# Patient Record
Sex: Male | Born: 1985
Health system: Southern US, Community
[De-identification: ages and names within clinical notes are randomized; demographics above are authoritative.]

## PROBLEM LIST (undated history)

## (undated) DIAGNOSIS — K649 Unspecified hemorrhoids: Secondary | ICD-10-CM

## (undated) DIAGNOSIS — J36 Peritonsillar abscess: Secondary | ICD-10-CM

## (undated) HISTORY — DX: Peritonsillar abscess: J36

## (undated) HISTORY — DX: Unspecified hemorrhoids: K64.9

---

## 2009-05-26 ENCOUNTER — Emergency Department (HOSPITAL_COMMUNITY): Admission: EM | Admit: 2009-05-26 | Discharge: 2009-05-26 | Payer: Self-pay | Admitting: Emergency Medicine

## 2011-01-24 LAB — URINE CULTURE: Culture: NO GROWTH

## 2011-01-24 LAB — URINALYSIS, ROUTINE W REFLEX MICROSCOPIC
Glucose, UA: NEGATIVE mg/dL
Ketones, ur: NEGATIVE mg/dL
Leukocytes, UA: NEGATIVE
Nitrite: NEGATIVE
Protein, ur: 30 mg/dL — AB
pH: 5.5 (ref 5.0–8.0)

## 2011-01-24 LAB — URINE MICROSCOPIC-ADD ON

## 2011-07-12 IMAGING — CT CT PELVIS W/O CM
2 of 4 series · 17 of 46 positions shown, 19 images · non-contrast
Comparison: None

CT ABDOMEN

CLINICAL DATA: Right-sided flank pain

CT ABDOMEN AND PELVIS WITHOUT CONTRAST
TECHNIQUE: Multidetector CT imaging of the abdomen and pelvis was
performed following the standard protocol without intravenous
contrast.

[Series 2: under 200# stone no prev · axial · 0.62mm/px · z∈[-386,-72]mm · 14 of 69 slices shown, 16 images]
[im 3/69  soft-tissue]
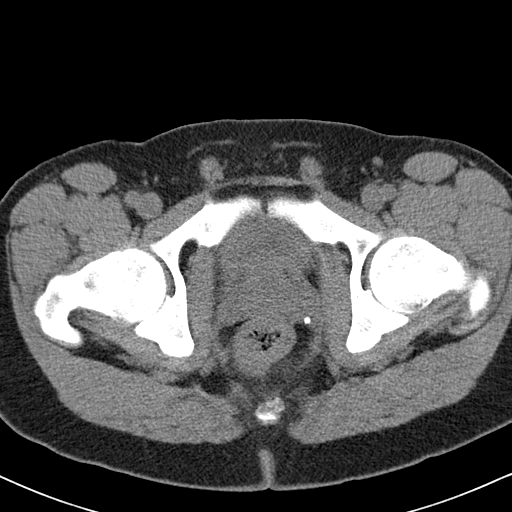
[im 3/69  bone]
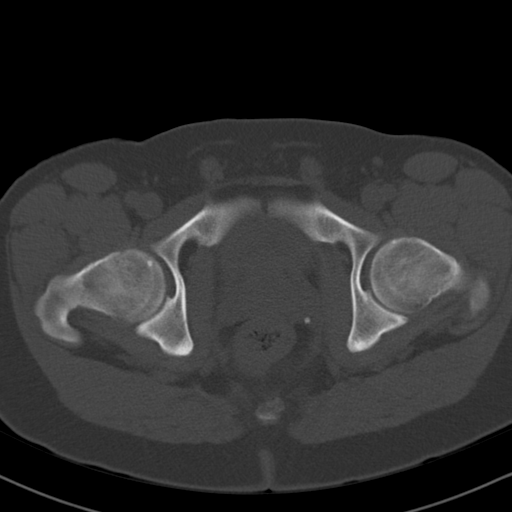
[im 8/69  soft-tissue]
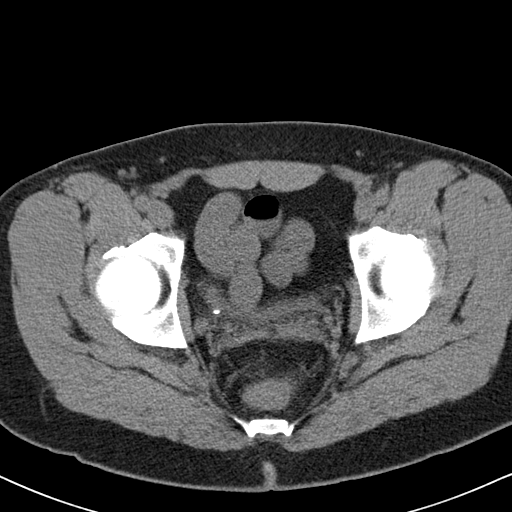
[im 13/69  soft-tissue]
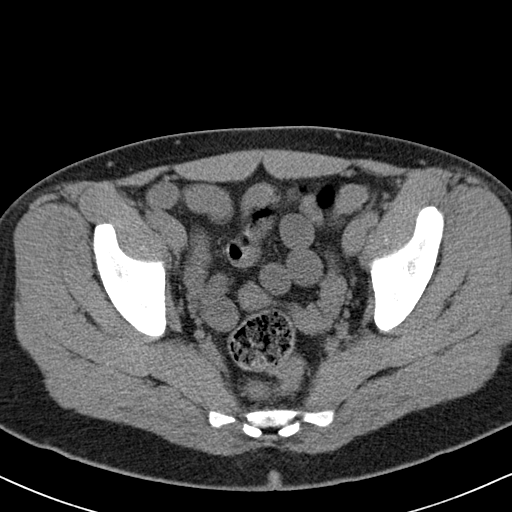
[im 18/69  soft-tissue]
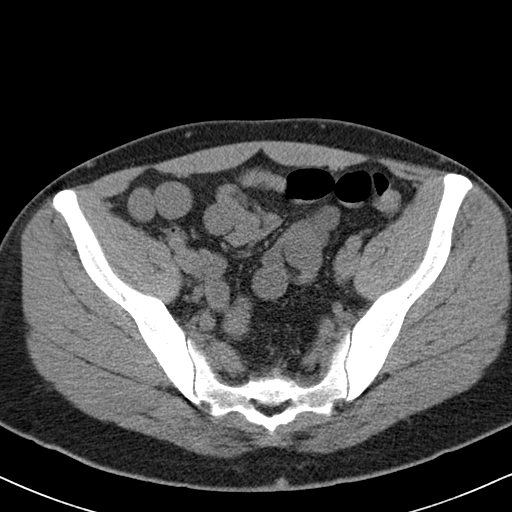
[im 23/69  soft-tissue]
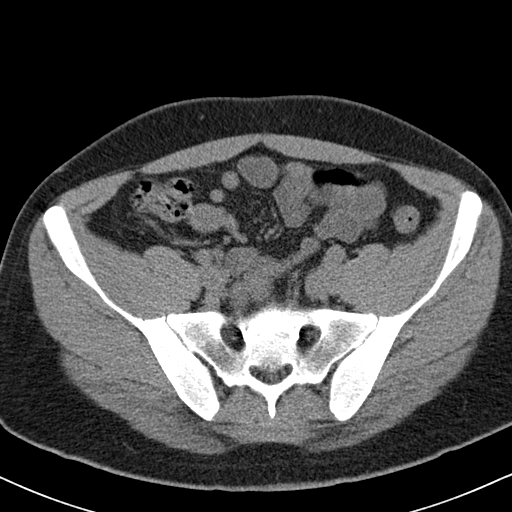
[im 28/69  soft-tissue]
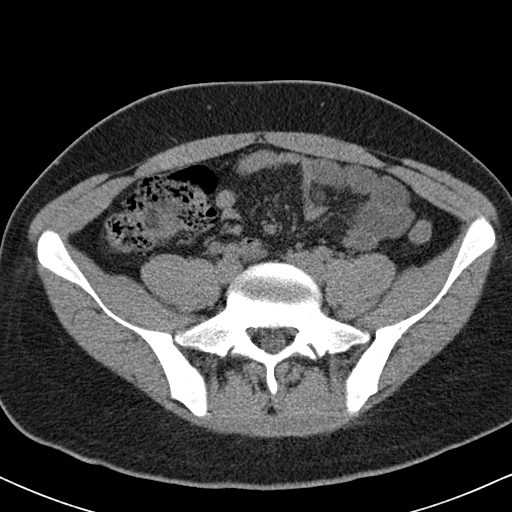
[im 33/69  soft-tissue]
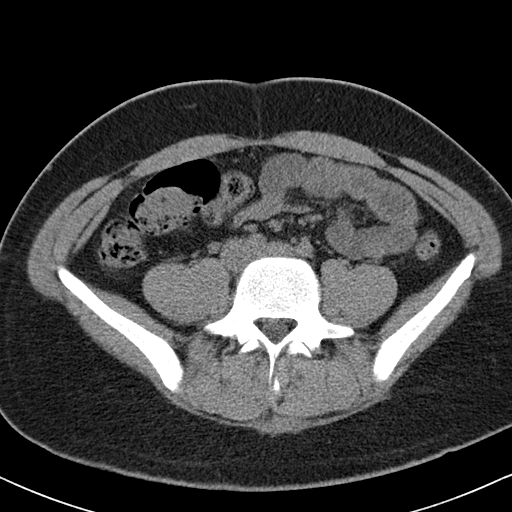
[im 36/69  soft-tissue]
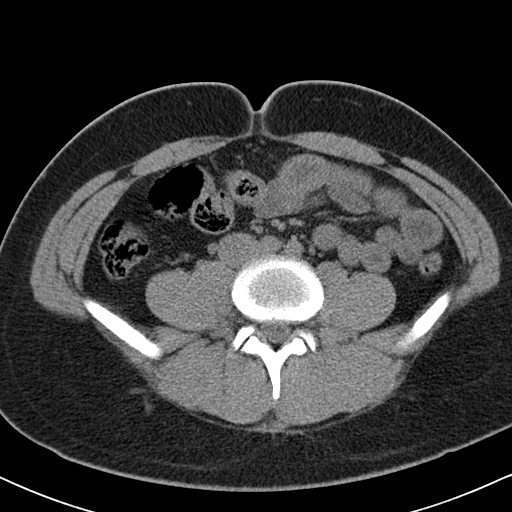
[im 41/69  soft-tissue]
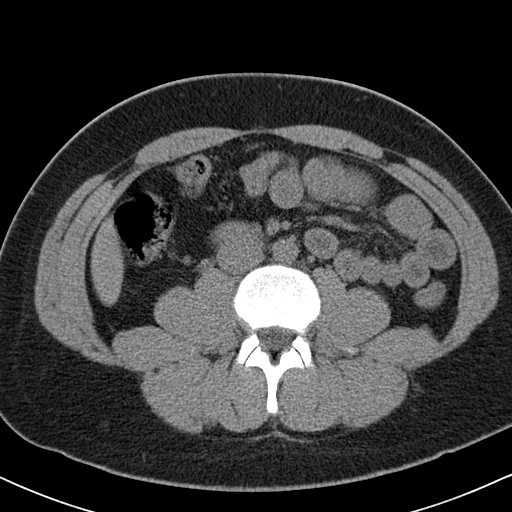
[im 41/69  bone]
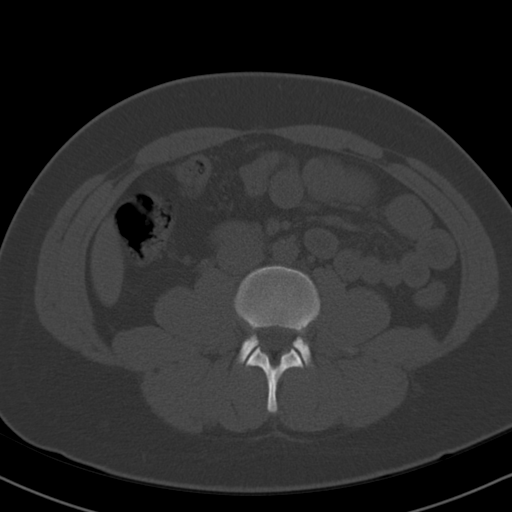
[im 46/69  soft-tissue]
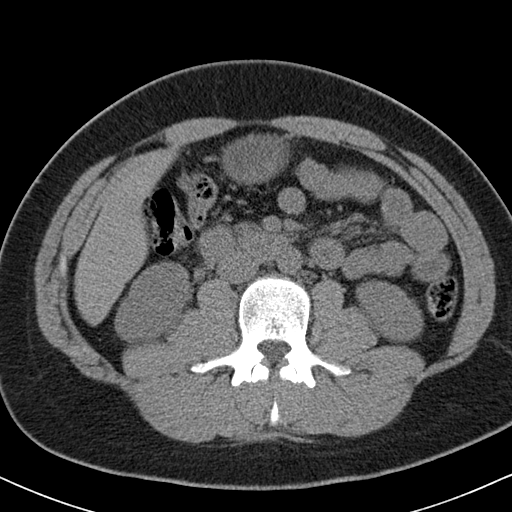
[im 51/69  soft-tissue]
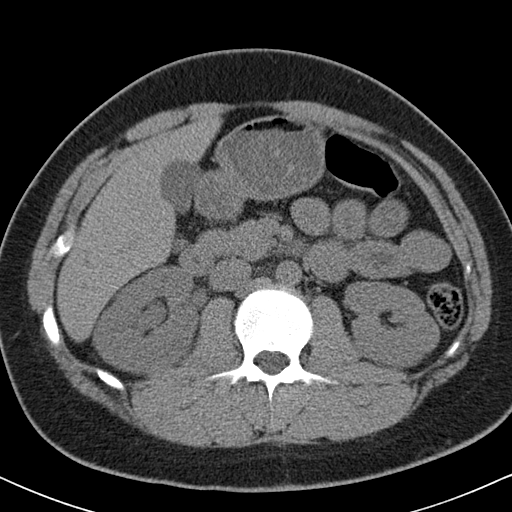
[im 56/69  soft-tissue]
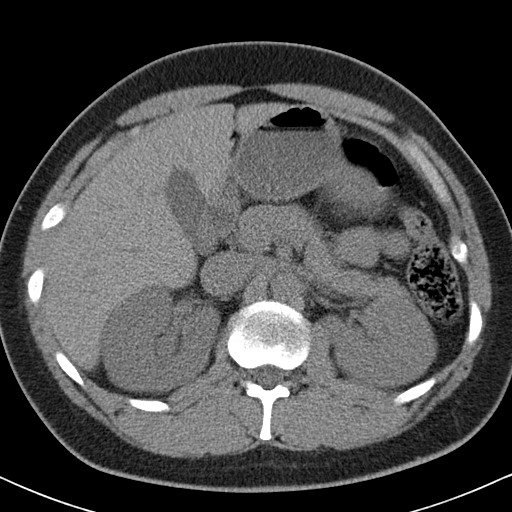
[im 61/69  soft-tissue]
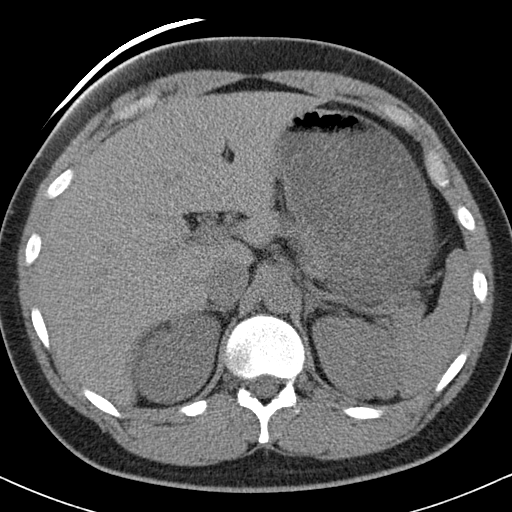
[im 66/69  soft-tissue]
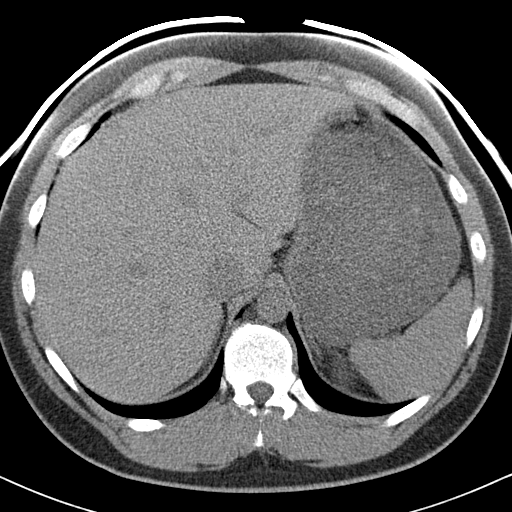

[Series 602: <mpr thick range> · coronal · 0.71mm/px · 3 of 74 slices shown]
[im 25/74  soft-tissue]
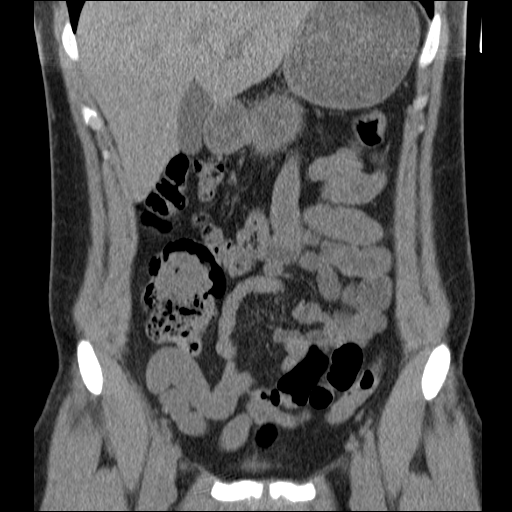
[im 33/74  soft-tissue]
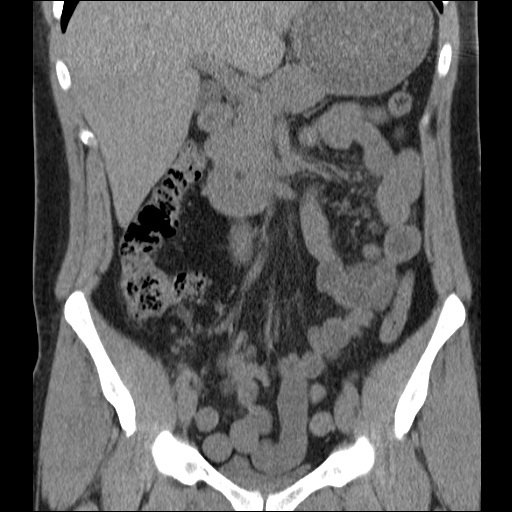
[im 41/74  soft-tissue]
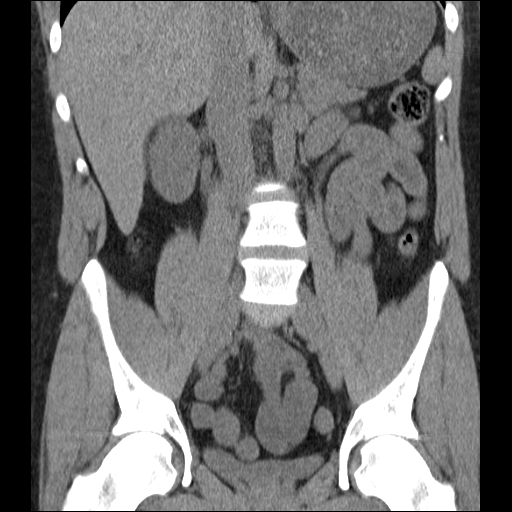

[17 of 46 positions shown; findings below may reference images not displayed]

FINDINGS: There is mild right hydroureteronephrosis to the level
of a 5 mm distal right ureteral stone.  The bladder is
decompressed.  No other radiopaque renal or ureteral stone is
identified on either side.

Visualized aspects of the unenhanced abdominal viscera are
otherwise unremarkable.  No free fluid or air.
IMPRESSION: 5 mm distal right ureteral calculus producing mild right
hydroureteronephrosis.

CT PELVIS
FINDINGS: The appendix and unopacified bowel are unremarkable.  No
pelvic free fluid.  Sclerotic foci in the sacrum and right femur
are most likely bone islands.  No acute osseous abnormality.
IMPRESSION: No acute intrapelvic finding.  Please see CT abdomen report above.

## 2011-11-12 ENCOUNTER — Encounter (HOSPITAL_COMMUNITY): Payer: Self-pay | Admitting: Emergency Medicine

## 2011-11-12 ENCOUNTER — Emergency Department (HOSPITAL_COMMUNITY)
Admission: EM | Admit: 2011-11-12 | Discharge: 2011-11-12 | Disposition: A | Discharge status: Home / Self Care | Payer: 59 | Attending: Emergency Medicine | Admitting: Emergency Medicine

## 2011-11-12 DIAGNOSIS — R509 Fever, unspecified: Secondary | ICD-10-CM | POA: Insufficient documentation

## 2011-11-12 DIAGNOSIS — Z79899 Other long term (current) drug therapy: Secondary | ICD-10-CM | POA: Insufficient documentation

## 2011-11-12 DIAGNOSIS — J36 Peritonsillar abscess: Secondary | ICD-10-CM | POA: Insufficient documentation

## 2011-11-12 DIAGNOSIS — F17200 Nicotine dependence, unspecified, uncomplicated: Secondary | ICD-10-CM | POA: Insufficient documentation

## 2011-11-12 LAB — CBC
MCHC: 34 g/dL (ref 30.0–36.0)
Platelets: 303 10*3/uL (ref 150–400)
RDW: 13.8 % (ref 11.5–15.5)
WBC: 19.8 10*3/uL — ABNORMAL HIGH (ref 4.0–10.5)

## 2011-11-12 LAB — DIFFERENTIAL
Basophils Absolute: 0.1 10*3/uL (ref 0.0–0.1)
Basophils Relative: 1 % (ref 0–1)
Lymphocytes Relative: 20 % (ref 12–46)
Neutro Abs: 14.4 10*3/uL — ABNORMAL HIGH (ref 1.7–7.7)
Neutrophils Relative %: 72 % (ref 43–77)

## 2011-11-12 MED ORDER — MORPHINE SULFATE 4 MG/ML IJ SOLN
4.0000 mg | Freq: Once | INTRAMUSCULAR | Status: AC
  Administered 2011-11-12: 4 mg via INTRAVENOUS
  Filled 2011-11-12: qty 1

## 2011-11-12 MED ORDER — ONDANSETRON HCL 4 MG/2ML IJ SOLN
4.0000 mg | Freq: Once | INTRAMUSCULAR | Status: AC
  Administered 2011-11-12: 4 mg via INTRAVENOUS
  Filled 2011-11-12: qty 2

## 2011-11-12 MED ORDER — CLINDAMYCIN PHOSPHATE 900 MG/50ML IV SOLN
900.0000 mg | Freq: Once | INTRAVENOUS | Status: AC
  Administered 2011-11-12: 900 mg via INTRAVENOUS
  Filled 2011-11-12: qty 50

## 2011-11-12 MED ORDER — CLINDAMYCIN HCL 150 MG PO CAPS: 300.0000 mg | ORAL_CAPSULE | Freq: Three times a day (TID) | ORAL | Status: DC

## 2011-11-12 MED ORDER — DEXAMETHASONE SODIUM PHOSPHATE 10 MG/ML IJ SOLN
10.0000 mg | Freq: Once | INTRAMUSCULAR | Status: AC
  Administered 2011-11-12: 10 mg via INTRAVENOUS
  Filled 2011-11-12: qty 1

## 2011-11-12 MED ORDER — SODIUM CHLORIDE 0.9 % IV BOLUS (SEPSIS)
500.0000 mL | Freq: Once | INTRAVENOUS | Status: AC
  Administered 2011-11-12: 500 mL via INTRAVENOUS

## 2011-11-12 MED ORDER — HYDROCODONE-ACETAMINOPHEN 7.5-500 MG/15ML PO SOLN: 15.0000 mL | Freq: Four times a day (QID) | ORAL | Status: DC | PRN

## 2011-11-12 NOTE — ED Provider Notes (Signed)
Pt with a sore throat. pe small tonsilar swelling and abscess.  Medical screening examination/treatment/procedure(s) were conducted as a shared visit with non-physician practitioner(s) and myself.  I personally evaluated the patient during the encounter   Benny Lennert, MD 11/12/11 2304

## 2011-11-12 NOTE — ED Notes (Signed)
Pt alert, nad, c/o sore throat, onset yesterday, went to [facility] this AM, - strep, Right tonsil visualized, + pustule noted, airway patent, skin pwd, resp even unlabored

## 2011-11-12 NOTE — ED Provider Notes (Signed)
History     CSN: [id number]  Arrival date & time 11/12/11  2100   First MD Initiated Contact with Patient 11/12/11 2124      Chief Complaint  Patient presents with  . Sore Throat    (Consider location/radiation/quality/duration/timing/severity/associated sxs/prior treatment) HPI Comments: Patient with progressively worsening sore throat, worse on the right, left, over the past 2 days when to me in the clinic today had a negative strep test was told to take over-the-counter ibuprofen and drink clear fluids.  Patient has been doing this, but now reports increased difficulty swallowing, despite over-the-counter use of ibuprofen or Tylenol for fevers, chills, and a muffled voice  Patient is a 26 y.o. male presenting with pharyngitis. The history is provided by the patient. The history is limited by a language barrier.  Sore Throat This is a new problem. The current episode started yesterday. The problem occurs constantly. The problem has been gradually worsening. Associated symptoms include anorexia, chills, a fever, a sore throat and swollen glands. Pertinent negatives include no congestion, myalgias, nausea, neck pain or vomiting.    History reviewed. No pertinent past medical history.  History reviewed. No pertinent past surgical history.  No family history on file.  History  Substance Use Topics  . Smoking status: Current Everyday Smoker -- 1.0 packs/day  . Smokeless tobacco: Not on file  . Alcohol Use: No      Review of Systems  Constitutional: Positive for fever and chills.  HENT: Positive for sore throat, trouble swallowing and voice change. Negative for congestion and neck pain.   Respiratory: Negative for shortness of breath.   Gastrointestinal: Positive for anorexia. Negative for nausea and vomiting.  Musculoskeletal: Negative for myalgias.  Neurological: Negative for dizziness.    Allergies  Review of patient's allergies indicates no known allergies.  Home  Medications   Current Outpatient Rx  Name Route Sig Dispense Refill  . CALCIUM CARBONATE 600 MG PO TABS Oral Take 600 mg by mouth daily.    Marland Kitchen VITAMIN D 1000 UNITS PO TABS Oral Take 1,000 Units by mouth daily.    Marland Kitchen ESCITALOPRAM OXALATE 10 MG PO TABS Oral Take 10 mg by mouth daily.    . OMEGA-3 FATTY ACIDS 1000 MG PO CAPS Oral Take 1 g by mouth daily.    Marland Kitchen MENTHOL 3 MG MT LOZG Oral Take 1 lozenge by mouth as needed. Sore throat    . CLINDAMYCIN HCL 150 MG PO CAPS Oral Take 2 capsules (300 mg total) by mouth 3 (three) times daily. 28 capsule 0  . HYDROCODONE-ACETAMINOPHEN 7.5-500 MG/15ML PO SOLN Oral Take 15 mLs by mouth every 6 (six) hours as needed for pain. 120 mL 0    BP 121/69  Pulse 87  Temp(Src) 98.6 F (37 C) (Oral)  Resp 20  Wt 230 lb (104.327 kg)  SpO2 98%  Physical Exam  Constitutional: He is oriented to person, place, and time. He appears well-developed.  HENT:  Mouth/Throat: Uvula swelling present. Posterior oropharyngeal edema, posterior oropharyngeal erythema and tonsillar abscesses present.       R tonsil swollen purulent Uvula deviates to the L   Eyes: Pupils are equal, round, and reactive to light.  Neck: Normal range of motion.  Cardiovascular: Normal rate.   Pulmonary/Chest: Effort normal.  Musculoskeletal: Normal range of motion.  Neurological: He is alert and oriented to person, place, and time.  Skin: Skin is warm and dry.    ED Course  Procedures (including critical care time)  Labs Reviewed  CBC - Abnormal; Notable for the following:    WBC 19.8 (*)    All other components within normal limits  DIFFERENTIAL - Abnormal; Notable for the following:    Neutro Abs 14.4 (*)    Monocytes Absolute 1.4 (*)    All other components within normal limits  I-STAT, CHEM 8   No results found.   1. Peritonsillar abscess    Feels better, able to drink liquids.  Voice is stronger on reexam, uvula is still deviated to the left, but there is less edema   MDM    Enlarged right tonsil with purulent drainage, uvula deviated to the left        Arman Filter, NP 11/12/11 2303  Arman Filter, NP 11/12/11 2303

## 2011-11-13 ENCOUNTER — Encounter (HOSPITAL_COMMUNITY): Payer: Self-pay | Admitting: *Deleted

## 2011-11-13 ENCOUNTER — Emergency Department (HOSPITAL_COMMUNITY)
Admission: EM | Admit: 2011-11-13 | Discharge: 2011-11-13 | Disposition: A | Discharge status: Home / Self Care | Payer: 59 | Attending: Emergency Medicine | Admitting: Emergency Medicine

## 2011-11-13 DIAGNOSIS — R221 Localized swelling, mass and lump, neck: Secondary | ICD-10-CM | POA: Insufficient documentation

## 2011-11-13 DIAGNOSIS — R070 Pain in throat: Secondary | ICD-10-CM | POA: Insufficient documentation

## 2011-11-13 DIAGNOSIS — Z79899 Other long term (current) drug therapy: Secondary | ICD-10-CM | POA: Insufficient documentation

## 2011-11-13 DIAGNOSIS — R220 Localized swelling, mass and lump, head: Secondary | ICD-10-CM | POA: Insufficient documentation

## 2011-11-13 DIAGNOSIS — J36 Peritonsillar abscess: Secondary | ICD-10-CM | POA: Insufficient documentation

## 2011-11-13 NOTE — ED Notes (Signed)
Pt. Was seen at [facility] long by Elsie Stain for peritonsiler abscess.  Pt. Was told to come back today for re-evaluation.  Pt. Reports feeling "much better."

## 2011-11-13 NOTE — ED Provider Notes (Signed)
History     CSN: [id number]  Arrival date & time 11/13/11  1036   First MD Initiated Contact with Patient 11/13/11 1128      Chief Complaint  Patient presents with  . Sore Throat    (Consider location/radiation/quality/duration/timing/severity/associated sxs/prior treatment) Patient is a 26 y.o. male presenting with pharyngitis. The history is provided by the patient and the spouse.  Sore Throat Pertinent negatives include no headaches.  pt states right side throat pain for past few days. No fever/chills. Hurts to swallow but is able to swallow, no difficulty breathing. Constant, dull, mild pain worse w swallowing. No known ill contacts. No trauma. Was seen in ED yesterday, dx w peritonsillar abscess, and was told to return to ER for recheck. Was placed on clindamycin yesterday. Notes the soreness and swelling seem much better but not resolved.   History reviewed. No pertinent past medical history.  History reviewed. No pertinent past surgical history.  History reviewed. No pertinent family history.  History  Substance Use Topics  . Smoking status: Current Everyday Smoker -- 1.0 packs/day  . Smokeless tobacco: Not on file  . Alcohol Use: No      Review of Systems  Constitutional: Negative for fever and chills.  HENT: Negative for congestion, rhinorrhea and neck stiffness.   Respiratory: Negative for cough.   Neurological: Negative for headaches.    Allergies  Review of patient's allergies indicates no known allergies.  Home Medications   Current Outpatient Rx  Name Route Sig Dispense Refill  . CALCIUM CARBONATE 600 MG PO TABS Oral Take 600 mg by mouth daily.    Marland Kitchen VITAMIN D 1000 UNITS PO TABS Oral Take 1,000 Units by mouth daily.    Marland Kitchen COENZYME Q10 30 MG PO CAPS Oral Take 30 mg by mouth daily.    Marland Kitchen ESCITALOPRAM OXALATE 10 MG PO TABS Oral Take 10 mg by mouth daily.    . OMEGA-3 FATTY ACIDS 1000 MG PO CAPS Oral Take 1 g by mouth daily.    Marland Kitchen MENTHOL 3 MG MT LOZG Oral  Take 1 lozenge by mouth as needed. Sore throat      BP 121/73  Pulse 64  Temp(Src) 97.9 F (36.6 C) (Oral)  Resp 20  SpO2 100%  Physical Exam  Nursing note and vitals reviewed. Constitutional: He is oriented to person, place, and time. He appears well-developed and well-nourished. No distress.  HENT:  Head: Atraumatic.       Pharynx mildly erythematous. Mild asymmetric tonsillar swelling greater on right. Uvula midline. No dysphonia or stridor. No trouble swallowing.   Eyes: Conjunctivae are normal.  Neck: Neck supple. No tracheal deviation present.       No stiffness/rigidity  Cardiovascular: Normal rate.   Pulmonary/Chest: Effort normal. No accessory muscle usage. No respiratory distress.  Abdominal: He exhibits no distension.  Musculoskeletal: Normal range of motion.  Neurological: He is alert and oriented to person, place, and time.  Skin: Skin is warm and dry. No rash noted.  Psychiatric: He has a normal mood and affect.    ED Course  Procedures (including critical care time)     MDM  Reviewed prior chart and nursing notes. ent called.  Discussed pt with DR Jearld Fenton, ENT on call, as pt feels much improved, states to have continue on current abx therapy, and to have f/u office Monday if not continuing to improve/resolve, return to ED if worse prior to then.   Discussed plan w pt, pt agreeable w plan.  Suzi Roots, MD 11/13/11 1155

## 2011-11-13 NOTE — ED Notes (Signed)
MD at bedside. 

## 2011-11-15 LAB — POCT I-STAT, CHEM 8
Calcium, Ion: 1.16 mmol/L (ref 1.12–1.32)
Chloride: 106 mEq/L (ref 96–112)
HCT: 45 % (ref 39.0–52.0)
Potassium: 4.8 mEq/L (ref 3.5–5.1)
Sodium: 140 mEq/L (ref 135–145)

## 2011-11-25 NOTE — ED Provider Notes (Signed)
Medical screening examination/treatment/procedure(s) were conducted as a shared visit with non-physician practitioner(s) and myself.  I personally evaluated the patient during the encounter   Benny Lennert, MD 11/25/11 [phone]

## 2018-12-04 ENCOUNTER — Ambulatory Visit: Payer: BLUE CROSS/BLUE SHIELD | Admitting: Medical

## 2018-12-04 ENCOUNTER — Encounter: Payer: Self-pay | Admitting: Medical

## 2018-12-04 VITALS — BP 127/88 | HR 92 | Temp 98.2°F | Resp 16 | Ht 70.0 in | Wt 261.2 lb

## 2018-12-04 DIAGNOSIS — J029 Acute pharyngitis, unspecified: Secondary | ICD-10-CM | POA: Diagnosis not present

## 2018-12-04 LAB — POCT RAPID STREP A (OFFICE): Rapid Strep A Screen: POSITIVE — AB

## 2018-12-04 MED ORDER — AMOXICILLIN 875 MG PO TABS: 875.0000 mg | ORAL_TABLET | Freq: Two times a day (BID) | ORAL | 0 refills | Status: DC

## 2018-12-04 MED ORDER — PENICILLIN G BENZATHINE 2400000 UNIT/4ML IM SUSP
1.2000 10*6.[IU] | Freq: Once | INTRAMUSCULAR | Status: AC
  Administered 2018-12-04: 1200000 [IU] via INTRAMUSCULAR

## 2018-12-04 NOTE — Patient Instructions (Signed)
Your strep test was positive. We gave you bicillin im injection I am also prescribing antibiotic oral amoxicillin.  Rest hydrate, tylenol for fever and warm salt water gargles.   We need to follow you closely in light of your history of peritonsillar abscess in 2013.  Follow up in 7 days or as needed.

## 2018-12-04 NOTE — Progress Notes (Signed)
Subjective:    Patient ID: Stephen Carson, male    DOB: [birth year], 33 y.o.   MRN: [id number]  HPI  Pt here for the first time.   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 No alcohol. Smokes about 1/2 pack a day. He is married.   Pt in with swollen tonsils and painful swallowing. Mild pain Saturday. But pain worse on Sunday morning(noticed this is when first saw swelling). Some fever, some chills and body aches.   Pt has no allergies to meds.    Review of Systems  Constitutional: Positive for chills, fatigue and fever.  HENT: Positive for sore throat. Negative for congestion, nosebleeds, postnasal drip, rhinorrhea, sinus pressure and sinus pain.   Respiratory: Negative for cough, chest tightness, shortness of breath and wheezing.   Gastrointestinal: Negative for abdominal pain.  Musculoskeletal: Positive for myalgias. Negative for back pain.  Skin: Negative for rash.  Neurological: Negative for dizziness, facial asymmetry and numbness.  Hematological: Positive for adenopathy. Does not bruise/bleed easily.  Psychiatric/Behavioral: Negative for dysphoric mood and suicidal ideas. The patient is not nervous/anxious.    No past medical history on file.   Social History   Socioeconomic History  . Marital status: Married    Spouse name: Not on file  . Number of children: Not on file  . Years of education: Not on file  . Highest education level: Not on file  Occupational History  . Not on file  Social Needs  . Financial resource strain: Not on file  . Food insecurity:    Worry: Not on file    Inability: Not on file  . Transportation needs:    Medical: Not on file    Non-medical: Not on file  Tobacco Use  . Smoking status: Current Every Day Smoker    Packs/day: 1.00  Substance and Sexual Activity  . Alcohol use: No  . Drug use: No  . Sexual activity: Never  Lifestyle  . Physical activity:    Days per week: Not on file    Minutes per session: Not on file   . Stress: Not on file  Relationships  . Social connections:    Talks on phone: Not on file    Gets together: Not on file    Attends religious service: Not on file    Active member of club or organization: Not on file    Attends meetings of clubs or organizations: Not on file    Relationship status: Not on file  . Intimate partner violence:    Fear of current or ex partner: Not on file    Emotionally abused: Not on file    Physically abused: Not on file    Forced sexual activity: Not on file  Other Topics Concern  . Not on file  Social History Narrative  . Not on file    No past surgical history on file.  No family history on file.  No Known Allergies  Current Outpatient Medications on File Prior to Visit  Medication Sig Dispense Refill  . escitalopram (LEXAPRO) 10 MG tablet Take 10 mg by mouth daily.    . fish oil-omega-3 fatty acids 1000 MG capsule Take 1 g by mouth daily.    Marland Kitchen menthol-cetylpyridinium (CEPACOL) 3 MG lozenge Take 1 lozenge by mouth as needed. Sore throat     No current facility-administered medications on file prior to visit.     BP 127/88   Pulse 92   Temp 98.2 F (36.8 C) (  Oral)   Resp 16   Ht 5\' 10"  (1.778 m)   Wt 261 lb 3.2 oz (118.5 kg)   SpO2 99%   BMI 37.48 kg/m       Objective:   Physical Exam  General  Mental Status - Alert. General Appearance - Well groomed. Not in acute distress.  Skin Rashes- No Rashes.  HEENT Head- Normal. Ear Auditory Canal - Left- Normal. Right - Normal.Tympanic Membrane- Left- Normal. Right- Normal. Eye Sclera/Conjunctiva- Left- Normal. Right- Normal. Nose & Sinuses Nasal Mucosa- Left-  Boggy and Congested. Right-  Boggy and  Congested.Bilateral  no maxillary and no  frontal sinus pressure. Mouth & Throat Lips: Upper Lip- Normal: no dryness, cracking, pallor, cyanosis, or vesicular eruption. Lower Lip-Normal: no dryness, cracking, pallor, cyanosis or vesicular eruption. Buccal Mucosa- Bilateral- No  Aphthous ulcers. Oropharynx- No Discharge or Erythema. Tonsils: Characteristics- Bilateral-  Erythema . Size/Enlargement- Bilateral- 2+ enlargement(symmeteric). Discharge- bilateral-None.  Neck Neck- Supple. No Masses.   Chest and Lung Exam Auscultation: Breath Sounds:-Clear even and unlabored.  Cardiovascular Auscultation:Rythm- Regular, rate and rhythm. Murmurs & Other Heart Sounds:Ausculatation of the heart reveal- No Murmurs.  Lymphatic Head & Neck General Head & Neck Lymphatics: Bilateral: Description- Swollen submandibular lymph nodes.       Assessment & Plan:  Your strep test was positive. We gave you bicillin im injection I am also prescribing antibiotic oral amoxicillin.  Rest hydrate, tylenol for fever and warm salt water gargles.   We need to follow you closely in light of your history of peritonsillar abscess in 2013.  Follow up in 7 days or as needed.  Mackie Pai, PA-C

## 2018-12-14 ENCOUNTER — Encounter: Payer: Self-pay | Admitting: Medical

## 2018-12-14 ENCOUNTER — Ambulatory Visit: Payer: BLUE CROSS/BLUE SHIELD | Admitting: Medical

## 2018-12-14 VITALS — BP 139/80 | HR 76 | Temp 98.0°F | Resp 16 | Ht 70.0 in | Wt 268.6 lb

## 2018-12-14 DIAGNOSIS — Z0000 Encounter for general adult medical examination without abnormal findings: Secondary | ICD-10-CM | POA: Diagnosis not present

## 2018-12-14 DIAGNOSIS — Z113 Encounter for screening for infections with a predominantly sexual mode of transmission: Secondary | ICD-10-CM | POA: Diagnosis not present

## 2018-12-14 LAB — COMPREHENSIVE METABOLIC PANEL
ALBUMIN: 4.2 g/dL (ref 3.5–5.2)
ALK PHOS: 76 U/L (ref 39–117)
ALT: 68 U/L — AB (ref 0–53)
AST: 36 U/L (ref 0–37)
BILIRUBIN TOTAL: 0.4 mg/dL (ref 0.2–1.2)
BUN: 11 mg/dL (ref 6–23)
CALCIUM: 9.5 mg/dL (ref 8.4–10.5)
CO2: 29 mEq/L (ref 19–32)
CREATININE: 0.79 mg/dL (ref 0.40–1.50)
Chloride: 106 mEq/L (ref 96–112)
GFR: 111.96 mL/min (ref >60.00)
Glucose, Bld: 89 mg/dL (ref 70–99)
Potassium: 4.7 mEq/L (ref 3.5–5.1)
SODIUM: 143 meq/L (ref 135–145)
TOTAL PROTEIN: 7.4 g/dL (ref 6.0–8.3)

## 2018-12-14 LAB — CBC WITH DIFFERENTIAL/PLATELET
BASOS PCT: 1 % (ref 0.0–3.0)
Basophils Absolute: 0.1 10*3/uL (ref 0.0–0.1)
EOS ABS: 0.3 10*3/uL (ref 0.0–0.7)
Eosinophils Relative: 2.3 % (ref 0.0–5.0)
HEMATOCRIT: 41.9 % (ref 39.0–52.0)
HEMOGLOBIN: 14.2 g/dL (ref 13.0–17.0)
LYMPHS PCT: 29.9 % (ref 12.0–46.0)
Lymphs Abs: 3.8 10*3/uL (ref 0.7–4.0)
MCHC: 33.8 g/dL (ref 30.0–36.0)
MCV: 90.5 fl (ref 78.0–100.0)
MONO ABS: 0.8 10*3/uL (ref 0.1–1.0)
Monocytes Relative: 6.2 % (ref 3.0–12.0)
Neutro Abs: 7.6 10*3/uL (ref 1.4–7.7)
Neutrophils Relative %: 60.6 % (ref 43.0–77.0)
Platelets: 369 10*3/uL (ref 150.0–400.0)
RBC: 4.63 Mil/uL (ref 4.22–5.81)
RDW: 13.4 % (ref 11.5–15.5)
WBC: 12.6 10*3/uL — AB (ref 4.0–10.5)

## 2018-12-14 LAB — LIPID PANEL
CHOLESTEROL: 167 mg/dL (ref 0–200)
HDL: 28.5 mg/dL — ABNORMAL LOW (ref >39.00)
LDL Cholesterol: 102 mg/dL — ABNORMAL HIGH (ref 0–99)
NonHDL: 138.34
TRIGLYCERIDES: 183 mg/dL — AB (ref 0.0–149.0)
Total CHOL/HDL Ratio: 6
VLDL: 36.6 mg/dL (ref 0.0–40.0)

## 2018-12-14 NOTE — Patient Instructions (Addendum)
For you wellness exam today I have ordered cbc, cmp, lipid panel and hiv.  Flu vaccine declined.  Recommend exercise and healthy diet.  We will let you know lab results as they come in.  Follow up date appointment will be determined after lab review.   Your clinically feel better from strep throat. If you have recurrent pain please let me know/come back in for check up.  Counseled on stop smoking. When ready can give rx help if you want.   Preventive Care 18-39 Years, Male Preventive care refers to lifestyle choices and visits with your health care provider that can promote health and wellness. What does preventive care include?   A yearly physical exam. This is also called an annual well check.  Dental exams once or twice a year.  Routine eye exams. Ask your health care provider how often you should have your eyes checked.  Personal lifestyle choices, including: ? Daily care of your teeth and gums. ? Regular physical activity. ? Eating a healthy diet. ? Avoiding tobacco and drug use. ? Limiting alcohol use. ? Practicing safe sex. What happens during an annual well check? The services and screenings done by your health care provider during your annual well check will depend on your age, overall health, lifestyle risk factors, and family history of disease. Counseling Your health care provider may ask you questions about your:  Alcohol use.  Tobacco use.  Drug use.  Emotional well-being.  Home and relationship well-being.  Sexual activity.  Eating habits.  Work and work Statistician. Screening You may have the following tests or measurements:  Height, weight, and BMI.  Blood pressure.  Lipid and cholesterol levels. These may be checked every 5 years starting at age 61.  Diabetes screening. This is done by checking your blood sugar (glucose) after you have not eaten for a while (fasting).  Skin check.  Hepatitis C blood test.  Hepatitis B blood  test.  Sexually transmitted disease (STD) testing. Discuss your test results, treatment options, and if necessary, the need for more tests with your health care provider. Vaccines Your health care provider may recommend certain vaccines, such as:  Influenza vaccine. This is recommended every year.  Tetanus, diphtheria, and acellular pertussis (Tdap, Td) vaccine. You may need a Td booster every 10 years.  Varicella vaccine. You may need this if you have not been vaccinated.  HPV vaccine. If you are 16 or younger, you may need three doses over 6 months.  Measles, mumps, and rubella (MMR) vaccine. You may need at least one dose of MMR.You may also need a second dose.  Pneumococcal 13-valent conjugate (PCV13) vaccine. You may need this if you have certain conditions and have not been vaccinated.  Pneumococcal polysaccharide (PPSV23) vaccine. You may need one or two doses if you smoke cigarettes or if you have certain conditions.  Meningococcal vaccine. One dose is recommended if you are age 45-21 years and a first-year college student living in a residence hall, or if you have one of several medical conditions. You may also need additional booster doses.  Hepatitis A vaccine. You may need this if you have certain conditions or if you travel or work in places where you may be exposed to hepatitis A.  Hepatitis B vaccine. You may need this if you have certain conditions or if you travel or work in places where you may be exposed to hepatitis B.  Haemophilus influenzae type b (Hib) vaccine. You may need this if you  have certain risk factors. Talk to your health care provider about which screenings and vaccines you need and how often you need them. This information is not intended to replace advice given to you by your health care provider. Make sure you discuss any questions you have with your health care provider. Document Released: 11/30/2001 Document Revised: 05/17/2017 Document Reviewed:  08/05/2015 Elsevier Interactive Patient Education  2019 [employer].

## 2018-12-14 NOTE — Progress Notes (Signed)
Subjective:    Patient ID: Stephen Carson, male    DOB: 1986/01/17, 33 y.o.   MRN: [id number]  HPI  Pt here for second time.   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 No alcohol. Smokes about 1/2 pack a day. He is married.   Pt is fasting. Last visit plan was to do wellness/cpe if he had no acute complaint.  Treated for strep on last visit. No symptoms now for 2 days. Pt finishing antibiotic today.   Review of Systems  Constitutional: Negative for chills, fatigue and fever.  HENT: Negative for congestion, ear discharge, ear pain, sinus pressure, sinus pain and sore throat.   Respiratory: Negative for chest tightness, shortness of breath and wheezing.   Cardiovascular: Negative for chest pain and palpitations.  Gastrointestinal: Negative for abdominal pain.  Genitourinary: Negative for dysuria.  Musculoskeletal: Negative for back pain and myalgias.  Skin: Negative for rash.  Neurological: Negative for dizziness, tremors, seizures and light-headedness.  Hematological: Negative for adenopathy. Does not bruise/bleed easily.  Psychiatric/Behavioral: Negative for behavioral problems, decreased concentration, dysphoric mood and sleep disturbance.     Past Medical History:  Diagnosis Date  . Peritonsillar abscess      Social History   Socioeconomic History  . Marital status: Married    Spouse name: Not on file  . Number of children: Not on file  . Years of education: Not on file  . Highest education level: Not on file  Occupational History  . Not on file  Social Needs  . Financial resource strain: Not on file  . Food insecurity:    Worry: Not on file    Inability: Not on file  . Transportation needs:    Medical: Not on file    Non-medical: Not on file  Tobacco Use  . Smoking status: Current Every Day Smoker    Packs/day: 1.00  . Smokeless tobacco: Never Used  Substance and Sexual Activity  . Alcohol use: No  . Drug use: No  . Sexual activity:  Never  Lifestyle  . Physical activity:    Days per week: Not on file    Minutes per session: Not on file  . Stress: Not on file  Relationships  . Social connections:    Talks on phone: Not on file    Gets together: Not on file    Attends religious service: Not on file    Active member of club or organization: Not on file    Attends meetings of clubs or organizations: Not on file    Relationship status: Not on file  . Intimate partner violence:    Fear of current or ex partner: Not on file    Emotionally abused: Not on file    Physically abused: Not on file    Forced sexual activity: Not on file  Other Topics Concern  . Not on file  Social History Narrative  . Not on file    No past surgical history on file.  No family history on file.  No Known Allergies  Current Outpatient Medications on File Prior to Visit  Medication Sig Dispense Refill  . amoxicillin (AMOXIL) 875 MG tablet Take 1 tablet (875 mg total) by mouth 2 (two) times daily. 20 tablet 0  . escitalopram (LEXAPRO) 10 MG tablet Take 10 mg by mouth daily.    . fish oil-omega-3 fatty acids 1000 MG capsule Take 1 g by mouth daily.    Marland Kitchen menthol-cetylpyridinium (CEPACOL) 3 MG lozenge Take 1  lozenge by mouth as needed. Sore throat     No current facility-administered medications on file prior to visit.     BP 139/80   Pulse 76   Temp 98 F (36.7 C) (Oral)   Resp 16   Ht 5\' 10"  (1.778 m)   Wt 268 lb 9.6 oz (121.8 kg)   SpO2 100%   BMI 38.54 kg/m       Objective:   Physical Exam  General Mental Status- Alert. General Appearance- Not in acute distress.   Skin General: Color- Normal Color. Moisture- Normal Moisture.  Neck Carotid Arteries- Normal color. Moisture- Normal Moisture. No carotid bruits. No JVD.  Chest and Lung Exam Auscultation: Breath Sounds:-Normal.  Cardiovascular Auscultation:Rythm- Regular. Murmurs & Other Heart Sounds:Auscultation of the heart reveals- No  Murmurs.  Abdomen Inspection:-Inspeection Normal. Palpation/Percussion:Note:No mass. Palpation and Percussion of the abdomen reveal- Non Tender, Non Distended + BS, no rebound or guarding.   Neurologic Cranial Nerve exam:- CN III-XII intact(No nystagmus), symmetric smile. Strength:- 5/5 equal and symmetric strength both upper and lower extremities.   HEENT Head- Normal. Ear Auditory Canal - Left- Normal. Right - Normal.Tympanic Membrane- Left- Normal. Right- Normal. Eye Sclera/Conjunctiva- Left- Normal. Right- Normal. Nose & Sinuses Nasal Mucosa- Left-  Boggy and Congested. Right-  Boggy and  Congested.Bilateral no maxillary and  No frontal sinus pressure. Mouth & Throat Lips: Upper Lip- Normal: no dryness, cracking, pallor, cyanosis, or vesicular eruption. Lower Lip-Normal: no dryness, cracking, pallor, cyanosis or vesicular eruption. Buccal Mucosa- Bilateral- No Aphthous ulcers. Oropharynx- No Discharge or Erythema. Tonsils: Characteristics- Bilateral- faint  Erythema or Congestion. Size/Enlargement- 1+ (baseline large tonsils?)Bilateral- 1+ enlargement. Discharge- bilateral-None.      Assessment & Plan:  For you wellness exam today I have ordered cbc, cmp, lipid panel and hiv.  Flu vaccine declined.  Recommend exercise and healthy diet.  We will let you know lab results as they come in.  Follow up date appointment will be determined after lab review.   Your clinically feel better from strep throat. If you have recurrent pain please let me know/come back in for check up.  Counseled on stop smoking. When ready can give rx help if you want.   Mackie Pai, PA-C

## 2018-12-15 LAB — HIV ANTIBODY (ROUTINE TESTING W REFLEX): HIV 1&2 Ab, 4th Generation: NONREACTIVE

## 2019-05-06 ENCOUNTER — Emergency Department (HOSPITAL_BASED_OUTPATIENT_CLINIC_OR_DEPARTMENT_OTHER)
Admission: EM | Admit: 2019-05-06 | Discharge: 2019-05-06 | Disposition: A | Discharge status: Home / Self Care | Payer: BC Managed Care – PPO | Attending: Emergency Medicine | Admitting: Emergency Medicine

## 2019-05-06 ENCOUNTER — Emergency Department (HOSPITAL_BASED_OUTPATIENT_CLINIC_OR_DEPARTMENT_OTHER): Payer: BC Managed Care – PPO

## 2019-05-06 ENCOUNTER — Encounter (HOSPITAL_BASED_OUTPATIENT_CLINIC_OR_DEPARTMENT_OTHER): Payer: Self-pay

## 2019-05-06 ENCOUNTER — Other Ambulatory Visit: Payer: Self-pay

## 2019-05-06 DIAGNOSIS — Z79899 Other long term (current) drug therapy: Secondary | ICD-10-CM | POA: Insufficient documentation

## 2019-05-06 DIAGNOSIS — N200 Calculus of kidney: Secondary | ICD-10-CM | POA: Diagnosis not present

## 2019-05-06 DIAGNOSIS — R1032 Left lower quadrant pain: Secondary | ICD-10-CM | POA: Diagnosis not present

## 2019-05-06 DIAGNOSIS — R109 Unspecified abdominal pain: Secondary | ICD-10-CM | POA: Diagnosis not present

## 2019-05-06 DIAGNOSIS — F17210 Nicotine dependence, cigarettes, uncomplicated: Secondary | ICD-10-CM | POA: Insufficient documentation

## 2019-05-06 LAB — URINALYSIS, ROUTINE W REFLEX MICROSCOPIC
Bilirubin Urine: NEGATIVE
Glucose, UA: NEGATIVE mg/dL
Ketones, ur: NEGATIVE mg/dL
Leukocytes,Ua: NEGATIVE
Nitrite: NEGATIVE
Protein, ur: 100 mg/dL — AB
Specific Gravity, Urine: >1.03 — ABNORMAL HIGH (ref 1.005–1.030)
pH: 5.5 (ref 5.0–8.0)

## 2019-05-06 LAB — URINALYSIS, MICROSCOPIC (REFLEX): RBC / HPF: >50 RBC/hpf (ref 0–5)

## 2019-05-06 MED ORDER — OXYCODONE-ACETAMINOPHEN 5-325 MG PO TABS: 1.0000 | ORAL_TABLET | Freq: Four times a day (QID) | ORAL | 0 refills | Status: DC | PRN

## 2019-05-06 MED ORDER — ONDANSETRON 8 MG PO TBDP: 8.0000 mg | ORAL_TABLET | Freq: Three times a day (TID) | ORAL | 0 refills | Status: DC | PRN

## 2019-05-06 MED ORDER — KETOROLAC TROMETHAMINE 30 MG/ML IJ SOLN
30.0000 mg | Freq: Once | INTRAMUSCULAR | Status: AC
  Administered 2019-05-06: 30 mg via INTRAMUSCULAR
  Filled 2019-05-06: qty 1

## 2019-05-06 MED ORDER — OXYCODONE-ACETAMINOPHEN 5-325 MG PO TABS
1.0000 | ORAL_TABLET | Freq: Once | ORAL | Status: AC
  Administered 2019-05-06: 1 via ORAL
  Filled 2019-05-06: qty 1

## 2019-05-06 NOTE — Discharge Instructions (Signed)
It was our pleasure to provide your ER care today - we hope that you feel better.  Drink plenty of fluids. Strain urine.  Take motrin or aleve as need for pain. You may also take percocet as need for pain. No driving for the next 6 hours or when taking percocet. Also, do not take tylenol or acetaminophen containing medication when taking percocet.   Take zofran as need for nausea.  Follow up with urologist in the next few days if symptoms fail to improve/resolve.  Return to ER if worse, new symptoms, fevers, persistent vomiting, intractable pain, other concern.

## 2019-05-06 NOTE — ED Triage Notes (Signed)
Pt c/o L groin pain that started today. Pt states it is similar to the last time he passed a kidney stone. Pt took ibuprofen PTA with improvement.

## 2019-05-06 NOTE — ED Provider Notes (Signed)
[facility] EMERGENCY DEPARTMENT Provider Note   CSN: [id number] Arrival date & time: 05/06/19  2230     History   Chief Complaint Chief Complaint  Patient presents with  . Groin Pain    HPI Stephen Carson is a 33 y.o. male.     Patient c/o acute onset left anterior flank pain radiating to left groin earlier this AM. Symptoms acute onset, mod-severe, waxing and waning, persistent. Felt similar to prior kidney stone. No hematuria or dysuria. No scrotal or testicular pain. No back pain. No fever or chills. Nausea. No vomiting or diarrhea. No constipation.   The history is provided by the patient.  Groin Pain Pertinent negatives include no chest pain, no abdominal pain, no headaches and no shortness of breath.    Past Medical History:  Diagnosis Date  . Peritonsillar abscess     There are no active problems to display for this patient.   History reviewed. No pertinent surgical history.      Home Medications    Prior to Admission medications   Medication Sig Start Date End Date Taking? Authorizing Provider  amoxicillin (AMOXIL) 875 MG tablet Take 1 tablet (875 mg total) by mouth 2 (two) times daily. 12/04/18   Saguier, Percell Miller, PA-C  escitalopram (LEXAPRO) 10 MG tablet Take 10 mg by mouth daily.    [provider]  fish oil-omega-3 fatty acids 1000 MG capsule Take 1 g by mouth daily.    [provider]  menthol-cetylpyridinium (CEPACOL) 3 MG lozenge Take 1 lozenge by mouth as needed. Sore throat    [provider]    Family History No family history on file.  Social History Social History   Tobacco Use  . Smoking status: Current Every Day Smoker    Packs/day: 1.00  . Smokeless tobacco: Never Used  Substance Use Topics  . Alcohol use: No  . Drug use: No     Allergies   Patient has no known allergies.   Review of Systems Review of Systems  Constitutional: Negative for chills and fever.  HENT: Negative for sore throat.    Eyes: Negative for redness.  Respiratory: Negative for shortness of breath.   Cardiovascular: Negative for chest pain.  Gastrointestinal: Negative for abdominal pain, constipation, diarrhea and vomiting.  Endocrine: Negative for polyuria.  Genitourinary: Negative for dysuria, flank pain and testicular pain.  Musculoskeletal: Negative for back pain and neck pain.  Skin: Negative for rash.  Neurological: Negative for headaches.  Hematological: Does not bruise/bleed easily.  Psychiatric/Behavioral: Negative for confusion.     Physical Exam Updated Vital Signs BP (!) 148/94 (BP Location: Left Arm)   Pulse 77   Temp 98.3 F (36.8 C) (Oral)   Resp 20   Ht 1.778 m (5\' 10" )   Wt 120.2 kg   SpO2 99%   BMI 38.02 kg/m   Physical Exam Vitals signs and nursing note reviewed.  Constitutional:      Appearance: Normal appearance. He is well-developed.  HENT:     Head: Atraumatic.     Nose: Nose normal.     Mouth/Throat:     Mouth: Mucous membranes are moist.     Pharynx: Oropharynx is clear.  Eyes:     General: No scleral icterus.    Conjunctiva/sclera: Conjunctivae normal.  Neck:     Musculoskeletal: Normal range of motion and neck supple. No neck rigidity.     Trachea: No tracheal deviation.  Cardiovascular:     Rate and Rhythm: Normal  rate.     Pulses: Normal pulses.  Pulmonary:     Effort: Pulmonary effort is normal. No accessory muscle usage or respiratory distress.     Breath sounds: Normal breath sounds.  Abdominal:     General: Bowel sounds are normal. There is no distension.     Palpations: Abdomen is soft. There is no mass.     Tenderness: There is no abdominal tenderness. There is no guarding.     Hernia: No hernia is present.  Genitourinary:    Comments: No cva tenderness. Normal external gu exam, no scrotal or testicular swelling or tenderness.  Musculoskeletal:        General: No swelling.  Skin:    General: Skin is warm and dry.     Findings: No rash.   Neurological:     Mental Status: He is alert.     Comments: Alert, speech clear.   Psychiatric:        Mood and Affect: Mood normal.      ED Treatments / Results  Labs (all labs ordered are listed, but only abnormal results are displayed) Labs Reviewed  URINALYSIS, ROUTINE W REFLEX MICROSCOPIC - Abnormal; Notable for the following components:      Result Value   Color, Urine RED (*)    APPearance CLOUDY (*)    Specific Gravity, Urine >1.030 (*)    Hgb urine dipstick LARGE (*)    Protein, ur 100 (*)    All other components within normal limits  URINALYSIS, MICROSCOPIC (REFLEX) - Abnormal; Notable for the following components:   Bacteria, UA FEW (*)    All other components within normal limits    EKG None  Radiology Dg Abd 1 View  Result Date: 05/06/2019 CLINICAL DATA:  Left flank/groin pain. History of kidney stone. EXAM: ABDOMEN - 1 VIEW COMPARISON:  CT 05/26/2009 FINDINGS: Three rounded calcifications in the left pelvis, at least 2 of which were present on prior exam and represent phleboliths. In all measure approximately 4 mm. 2 calcifications in the right pelvis which were not seen on prior CT. No other calcifications project over the renal beds or course of the ureters. Two bone islands in the left sacrum. Normal bowel gas pattern. No osseous abnormalities. IMPRESSION: Three rounded calcifications in the left pelvis, at least 2 of which were present on 2010 CT and represent phleboliths. The additional calcification may represent a phlebolith or distal ureteric stone given left-sided pain. Electronically Signed   By: Keith Rake M.D.   On: 05/06/2019 23:25    Procedures Procedures (including critical care time)  Medications Ordered in ED Medications  ketorolac (TORADOL) 30 MG/ML injection 30 mg (30 mg Intramuscular Given 05/06/19 2322)  oxyCODONE-acetaminophen (PERCOCET/ROXICET) 5-325 MG per tablet 1 tablet (1 tablet Oral Given 05/06/19 2321)     Initial Impression  / Assessment and Plan / ED Course  I have reviewed the triage vital signs and the nursing notes.  Pertinent labs & imaging results that were available during my care of the patient were reviewed by me and considered in my medical decision making (see chart for details).  Pt notes pain was severe earlier, but now markedly improved.  No meds pta. Pt has ride, does not have to drive.   Feels same as prior kidney stone pain.  Percocet po. toradol im.  Labs sent.   Reviewed nursing notes and prior charts for additional history.   Labs reviewed by me - ua with rbcs c/w ureteral stone.  xrays reviewed by me - c/w left sided stone.  Patient indicates pain is controlled. Appears comfortable.  rec urology f/u.  Return precautions provided.     Final Clinical Impressions(s) / ED Diagnoses   Final diagnoses:  None    ED Discharge Orders    None       Lajean Saver, MD 05/06/19 2340

## 2020-01-16 ENCOUNTER — Encounter: Payer: Self-pay | Admitting: Medical

## 2020-01-23 ENCOUNTER — Other Ambulatory Visit: Payer: Self-pay

## 2020-01-23 ENCOUNTER — Ambulatory Visit (INDEPENDENT_AMBULATORY_CARE_PROVIDER_SITE_OTHER): Payer: BC Managed Care – PPO | Admitting: Medical

## 2020-01-23 VITALS — BP 146/98 | HR 89

## 2020-01-23 DIAGNOSIS — R0683 Snoring: Secondary | ICD-10-CM

## 2020-01-23 NOTE — Progress Notes (Signed)
   Subjective:    Patient ID: Stephen Carson, male    DOB: 12-20-1985, 34 y.o.   MRN: [id number]  HPI  Virtual Visit via Video Note  I connected with Stephen Carson on 01/23/20 at  9:40 AM EDT by a video enabled telemedicine application and verified that I am speaking with the correct person using two identifiers.  Location: Patient: home Provider: office.   I discussed the limitations of evaluation and management by telemedicine and the availability of in person appointments. The patient expressed understanding and agreed to proceed.  History of Present Illness:   Pt states over last year wife notes it appears that he has periods where he does not breath. He can fall asleep but wakes up frequently. He states never feels well rested when he wakes. Feels tired and groggy in morning. Father in law saw him sleeping and he has sleep apnea himself and thinks [name] has as well. He snores a lot. Wife states she gets concerned when he stopped snoring and looks like not breathing well.   Pt blood pressure have been borderline.   Pt does smoke. He not ready to stop.     Observations/Objective:  General-no acute distress, pleasant, oriented. Lungs- on inspection lungs appear unlabored. Neck- no tracheal deviation or jvd on inspection. Neuro- gross motor function appears intact.     Assessment and Plan: For snoring and not feeling well rested in the morning, I went ahead and place referral to pulmonologist for evaluation.  Recommend some weight loss to help with probable sleep apnea.  For smoking cessation again offered Wellbutrin.  If you raise try that then just let me know.  Follow-up date to be determined after review of pulmonologist notes.  Stephen Pai, PA-C   Time spent with patient today was 20  minutes which consisted of chart review, discussing diagnosis, work up treatment and documentation.  Follow Up Instructions:    I discussed the assessment and treatment plan with  the patient. The patient was provided an opportunity to ask questions and all were answered. The patient agreed with the plan and demonstrated an understanding of the instructions.   The patient was advised to call back or seek an in-person evaluation if the symptoms worsen or if the condition fails to improve as anticipated.     Stephen Pai, PA-C    Review of Systems     Objective:   Physical Exam        Assessment & Plan:

## 2020-01-23 NOTE — Patient Instructions (Addendum)
For snoring and not feeling well rested in the morning, I went ahead and place referral to pulmonologist for evaluation.  Recommend some weight loss to help with probable sleep apnea.  For smoking cessation again offered Wellbutrin.  If you raise try that then just let me know.  Follow-up date to be determined after review of pulmonologist notes.

## 2020-02-20 ENCOUNTER — Institutional Professional Consult (permissible substitution): Payer: BC Managed Care – PPO | Admitting: Pulmonary Disease

## 2020-03-27 ENCOUNTER — Other Ambulatory Visit: Payer: Self-pay

## 2020-03-27 ENCOUNTER — Ambulatory Visit: Payer: BC Managed Care – PPO | Admitting: Pulmonary Disease

## 2020-03-27 ENCOUNTER — Encounter: Payer: Self-pay | Admitting: Pulmonary Disease

## 2020-03-27 VITALS — BP 128/82 | HR 96 | Temp 98.3°F | Ht 70.0 in | Wt 267.6 lb

## 2020-03-27 DIAGNOSIS — G4733 Obstructive sleep apnea (adult) (pediatric): Secondary | ICD-10-CM | POA: Diagnosis not present

## 2020-03-27 NOTE — Patient Instructions (Signed)
Moderate probability of significant obstructive sleep apnea  We will schedule you for home sleep study Update your results Treatment options as discussed  Follow-up in 3 months Call with significant concerns Sleep Apnea Sleep apnea is a condition in which breathing pauses or becomes shallow during sleep. Episodes of sleep apnea usually last 10 seconds or longer, and they may occur as many as 20 times an hour. Sleep apnea disrupts your sleep and keeps your body from getting the rest that it needs. This condition can increase your risk of certain health problems, including:  Heart attack.  Stroke.  Obesity.  Diabetes.  Heart failure.  Irregular heartbeat. What are the causes? There are three kinds of sleep apnea:  Obstructive sleep apnea. This kind is caused by a blocked or collapsed airway.  Central sleep apnea. This kind happens when the part of the brain that controls breathing does not send the correct signals to the muscles that control breathing.  Mixed sleep apnea. This is a combination of obstructive and central sleep apnea. The most common cause of this condition is a collapsed or blocked airway. An airway can collapse or become blocked if:  Your throat muscles are abnormally relaxed.  Your tongue and tonsils are larger than normal.  You are overweight.  Your airway is smaller than normal. What increases the risk? You are more likely to develop this condition if you:  Are overweight.  Smoke.  Have a smaller than normal airway.  Are elderly.  Are male.  Drink alcohol.  Take sedatives or tranquilizers.  Have a family history of sleep apnea. What are the signs or symptoms? Symptoms of this condition include:  Trouble staying asleep.  Daytime sleepiness and tiredness.  Irritability.  Loud snoring.  Morning headaches.  Trouble concentrating.  Forgetfulness.  Decreased interest in sex.  Unexplained sleepiness.  Mood swings.  Personality  changes.  Feelings of depression.  Waking up often during the night to urinate.  Dry mouth.  Sore throat. How is this diagnosed? This condition may be diagnosed with:  A medical history.  A physical exam.  A series of tests that are done while you are sleeping (sleep study). These tests are usually done in a sleep lab, but they may also be done at home. How is this treated? Treatment for this condition aims to restore normal breathing and to ease symptoms during sleep. It may involve managing health issues that can affect breathing, such as high blood pressure or obesity. Treatment may include:  Sleeping on your side.  Using a decongestant if you have nasal congestion.  Avoiding the use of depressants, including alcohol, sedatives, and narcotics.  Losing weight if you are overweight.  Making changes to your diet.  Quitting smoking.  Using a device to open your airway while you sleep, such as: ? An oral appliance. This is a custom-made mouthpiece that shifts your lower jaw forward. ? A continuous positive airway pressure (CPAP) device. This device blows air through a mask when you breathe out (exhale). ? A nasal expiratory positive airway pressure (EPAP) device. This device has valves that you put into each nostril. ? A bi-level positive airway pressure (BPAP) device. This device blows air through a mask when you breathe in (inhale) and breathe out (exhale).  Having surgery if other treatments do not work. During surgery, excess tissue is removed to create a wider airway. It is important to get treatment for sleep apnea. Without treatment, this condition can lead to:  High blood  pressure.  Coronary artery disease.  In men, an inability to achieve or maintain an erection (impotence).  Reduced thinking abilities. Follow these instructions at home: Lifestyle  Make any lifestyle changes that your health care provider recommends.  Eat a healthy, well-balanced  diet.  Take steps to lose weight if you are overweight.  Avoid using depressants, including alcohol, sedatives, and narcotics.  Do not use any products that contain nicotine or tobacco, such as cigarettes, e-cigarettes, and chewing tobacco. If you need help quitting, ask your health care provider. General instructions  Take over-the-counter and prescription medicines only as told by your health care provider.  If you were given a device to open your airway while you sleep, use it only as told by your health care provider.  If you are having surgery, make sure to tell your health care provider you have sleep apnea. You may need to bring your device with you.  Keep all follow-up visits as told by your health care provider. This is important. Contact a health care provider if:  The device that you received to open your airway during sleep is uncomfortable or does not seem to be working.  Your symptoms do not improve.  Your symptoms get worse. Get help right away if:  You develop: ? Chest pain. ? Shortness of breath. ? Discomfort in your back, arms, or stomach.  You have: ? Trouble speaking. ? Weakness on one side of your body. ? Drooping in your face. These symptoms may represent a serious problem that is an emergency. Do not wait to see if the symptoms will go away. Get medical help right away. Call your local emergency services (911 in the [place] Do not drive yourself to the hospital. Summary  Sleep apnea is a condition in which breathing pauses or becomes shallow during sleep.  The most common cause is a collapsed or blocked airway.  The goal of treatment is to restore normal breathing and to ease symptoms during sleep. This information is not intended to replace advice given to you by your health care provider. Make sure you discuss any questions you have with your health care provider. Document Revised: 03/21/2019 Document Reviewed: 05/30/2018 Elsevier Patient Education   Onset.

## 2020-03-27 NOTE — Progress Notes (Signed)
Stephen Carson    [id number]    23-Dec-1985  Primary Care Physician:Saguier, Iris Pert  Referring Physician: Mackie Pai, PA-C [address] [place],  [place] [address]  Chief complaint:   Nonrestorative sleep Witnessed apneas  HPI:  History of snoring, witnessed apneas Occasional gasping respirations Usually goes to bed between 12 and 1 AM Takes about 20 to 30 minutes to fall asleep About 4 awakenings for multiple reasons Final awakening time between 8 and 8:30 AM  Weight is up about 20 to 30 pounds  Occasional dryness of his mouth in the mornings No morning headaches Memory is okay  Dad does have obstructive sleep apnea  Active smoker  Outpatient Encounter Medications as of 03/27/2020  Medication Sig   Multiple Vitamins-Minerals (MULTIVITAMIN WITH MINERALS) tablet Take 1 tablet by mouth daily.   zinc gluconate 50 MG tablet Take 50 mg by mouth daily.   menthol-cetylpyridinium (CEPACOL) 3 MG lozenge Take 1 lozenge by mouth as needed. Sore throat   [DISCONTINUED] amoxicillin (AMOXIL) 875 MG tablet Take 1 tablet (875 mg total) by mouth 2 (two) times daily.   [DISCONTINUED] escitalopram (LEXAPRO) 10 MG tablet Take 10 mg by mouth daily.   [DISCONTINUED] fish oil-omega-3 fatty acids 1000 MG capsule Take 1 g by mouth daily.   [DISCONTINUED] ondansetron (ZOFRAN ODT) 8 MG disintegrating tablet Take 1 tablet (8 mg total) by mouth every 8 (eight) hours as needed for nausea or vomiting.   [DISCONTINUED] oxyCODONE-acetaminophen (PERCOCET/ROXICET) 5-325 MG tablet Take 1-2 tablets by mouth every 6 (six) hours as needed for moderate pain or severe pain.   No facility-administered encounter medications on file as of 03/27/2020.    Allergies as of 03/27/2020   (No Known Allergies)    Past Medical History:  Diagnosis Date   Peritonsillar abscess     No past surgical history on file.  No family history on file.  Social History    Socioeconomic History   Marital status: Married    Spouse name: Not on file   Number of children: Not on file   Years of education: Not on file   Highest education level: Not on file  Occupational History   Not on file  Tobacco Use   Smoking status: Current Every Day Smoker    Packs/day: 1.00   Smokeless tobacco: Never Used  Vaping Use   Vaping Use: Never used  Substance and Sexual Activity   Alcohol use: No   Drug use: No   Sexual activity: Never  Other Topics Concern   Not on file  Social History Narrative   Not on file   Social Determinants of Health   Financial Resource Strain:    Difficulty of Paying Living Expenses:   Food Insecurity:    Worried About Charity fundraiser in the Last Year:    Arboriculturist in the Last Year:   Transportation Needs:    Film/video editor (Medical):    Lack of Transportation (Non-Medical):   Physical Activity:    Days of Exercise per Week:    Minutes of Exercise per Session:   Stress:    Feeling of Stress :   Social Connections:    Frequency of Communication with Friends and Family:    Frequency of Social Gatherings with Friends and Family:    Attends Religious Services:    Active Member of Clubs or Organizations:    Attends Archivist Meetings:  Marital Status:   Intimate Partner Violence:    Fear of Current or Ex-Partner:    Emotionally Abused:    Physically Abused:    Sexually Abused:     Review of Systems  Respiratory: Positive for apnea.   Cardiovascular: Negative.   Gastrointestinal: Negative.   Psychiatric/Behavioral: Positive for sleep disturbance.    Vitals:   03/27/20 1333  BP: 128/82  Pulse: 96  Temp: 98.3 F (36.8 C)  SpO2: 98%     Physical Exam Constitutional:      Appearance: He is obese.  HENT:     Head: Normocephalic and atraumatic.     Nose: Nose normal. No congestion.     Mouth/Throat:     Mouth: Mucous membranes are moist.      Comments: Mallampati 4, crowded oropharynx, macroglossia Eyes:     General:        Right eye: No discharge.        Left eye: No discharge.     Pupils: Pupils are equal, round, and reactive to light.  Cardiovascular:     Rate and Rhythm: Normal rate and regular rhythm.     Pulses: Normal pulses.     Heart sounds: Normal heart sounds. No murmur heard.  No friction rub.  Pulmonary:     Effort: Pulmonary effort is normal. No respiratory distress.     Breath sounds: Normal breath sounds. No stridor. No wheezing, rhonchi or rales.  Musculoskeletal:     Cervical back: Tenderness present. No rigidity.  Neurological:     General: No focal deficit present.     Mental Status: He is alert.  Psychiatric:        Mood and Affect: Mood normal.    Results of the Epworth flowsheet 03/27/2020  Sitting and reading 0  Watching TV 3  Sitting, inactive in a public place (e.g. a theatre or a meeting) 0  As a passenger in a car for an hour without a break 0  Lying down to rest in the afternoon when circumstances permit 0  Sitting and talking to someone 0  Sitting quietly after a lunch without alcohol 1  In a car, while stopped for a few minutes in traffic 0  Total score 4    Assessment:  Moderate probability of significant obstructive sleep apnea  Morbid obesity  Pathophysiology of sleep disordered breathing discussed with patient Treatment options for sleep disordered breathing discussed with the patient  Plan/Recommendations: Risks of untreated obstructive sleep apnea discussed with the patient  We will schedule the patient for home sleep study Follow-up in 3 months Encouraged to call with any significant concerns  Weight loss efforts encouraged   Sherrilyn Rist MD [facility] 03/27/2020, 1:38 PM  CC: Mackie Pai, PA-C

## 2020-03-27 NOTE — Addendum Note (Signed)
Addended by: Satira Sark D on: 03/27/2020 04:09 PM   Modules accepted: Orders

## 2020-04-29 ENCOUNTER — Other Ambulatory Visit: Payer: Self-pay

## 2020-04-29 ENCOUNTER — Ambulatory Visit: Payer: BC Managed Care – PPO

## 2020-04-29 DIAGNOSIS — G4733 Obstructive sleep apnea (adult) (pediatric): Secondary | ICD-10-CM | POA: Diagnosis not present

## 2020-05-05 ENCOUNTER — Telehealth (HOSPITAL_COMMUNITY): Payer: Self-pay | Admitting: Pulmonary Disease

## 2020-05-05 DIAGNOSIS — G4733 Obstructive sleep apnea (adult) (pediatric): Secondary | ICD-10-CM | POA: Diagnosis not present

## 2020-05-05 NOTE — Telephone Encounter (Signed)
Call patient  Sleep study result  Date of study: 04/30/2020  Impression: Severe obstructive sleep apnea Moderate oxygen desaturations  Recommendation: Recommend CPAP therapy for severe obstructive sleep apnea Auto titrating CPAP with settings of 5-20 will be appropriate for treatment Encourage aggressive weight loss measures  Follow-up as previously scheduled, 4 to 6 weeks following initiation of treatment

## 2020-05-06 NOTE — Telephone Encounter (Signed)
Called and left message for patient to return call.  

## 2020-05-07 ENCOUNTER — Telehealth: Payer: Self-pay | Admitting: Pulmonary Disease

## 2020-05-07 DIAGNOSIS — G4733 Obstructive sleep apnea (adult) (pediatric): Secondary | ICD-10-CM

## 2020-05-07 NOTE — Telephone Encounter (Signed)
Results provided by triage earlier today.

## 2020-05-07 NOTE — Telephone Encounter (Signed)
Note Call patient  Sleep study result  Date of study: 04/30/2020  Impression: Severe obstructive sleep apnea Moderate oxygen desaturations  Recommendation: Recommend CPAP therapy for severe obstructive sleep apnea Auto titrating CPAP with settings of 5-20 will be appropriate for treatment Encourage aggressive weight loss measures  Follow-up as previously scheduled, 4 to 6 weeks following initiation of treatment     Patient is aware and order placed and follow up made.

## 2020-05-16 DIAGNOSIS — G4733 Obstructive sleep apnea (adult) (pediatric): Secondary | ICD-10-CM | POA: Diagnosis not present

## 2020-06-16 DIAGNOSIS — G4733 Obstructive sleep apnea (adult) (pediatric): Secondary | ICD-10-CM | POA: Diagnosis not present

## 2020-07-17 ENCOUNTER — Other Ambulatory Visit: Payer: Self-pay | Admitting: Adult Health

## 2020-07-17 DIAGNOSIS — G4733 Obstructive sleep apnea (adult) (pediatric): Secondary | ICD-10-CM

## 2020-07-18 ENCOUNTER — Encounter: Payer: Self-pay | Admitting: Adult Health

## 2020-07-18 ENCOUNTER — Ambulatory Visit: Payer: BC Managed Care – PPO | Admitting: Pulmonary Disease

## 2020-07-18 ENCOUNTER — Ambulatory Visit (INDEPENDENT_AMBULATORY_CARE_PROVIDER_SITE_OTHER): Payer: BC Managed Care – PPO | Admitting: Adult Health

## 2020-07-18 ENCOUNTER — Other Ambulatory Visit: Payer: Self-pay

## 2020-07-18 DIAGNOSIS — E668 Other obesity: Secondary | ICD-10-CM | POA: Diagnosis not present

## 2020-07-18 DIAGNOSIS — G4733 Obstructive sleep apnea (adult) (pediatric): Secondary | ICD-10-CM | POA: Diagnosis not present

## 2020-07-18 NOTE — Assessment & Plan Note (Signed)
Healthy weight loss discussed 

## 2020-07-18 NOTE — Patient Instructions (Addendum)
Keep up good work  Wear CPAP all night  Work on healthy weight  Do not drive if sleepy  Follow up with Dr. Ander Slade in 4-6 months and As needed

## 2020-07-18 NOTE — Progress Notes (Signed)
@Patient  ID: Stephen Carson, male    DOB: [birth year], 34 y.o.   MRN: [id number]  Chief Complaint  Patient presents with  . Follow-up    OSA     Referring provider: Elise Benne  HPI: 34 year old male seen for sleep consult March 27, 2020 with snoring daytime sleepiness and witnessed apneic events.  Patient was set up for a home sleep study that showed severe sleep apnea  TEST/EVENTS :  Home sleep study April 30, 2020 AHI 35.3/hour, SPO2 low 80%  07/18/2020 Follow up : OSA  Patient presents for a [facility] follow-up.  Patient was seen last visit for a sleep consult.  Patient had daytime sleepiness, snoring, witnessed apneic events.  He was set up for a home sleep study that showed severe obstructive sleep apnea with AHI at 35.3/hour and SPO2 low at 80%.  Patient was started on nocturnal CPAP.  Patient says he is feeling much improved.  Says he cannot believe the dramatic difference that he is noticed and how he feels.  His daytime grogginess and energy level has substantially improved.  Patient says he sleeps about 5 hours each night.  He wears his CPAP each night.  Patient says he change mask a couple times he has a lot of facial hair but now has a full facemask that fits underneath his nose that seems to work the best.  CPAP download shows excellent compliance with 100% usage.  Daily average usage at 5 hours.  Patient is on auto CPAP 5 to 20 cm H2O.  Daily average pressure at 13.7 cm H2O.  AHI 3.6.    No Known Allergies   There is no immunization history on file for this patient.  Past Medical History:  Diagnosis Date  . Peritonsillar abscess     Tobacco History: Social History   Tobacco Use  Smoking Status Current Every Day Smoker  . Packs/day: 1.00  Smokeless Tobacco Never Used  Tobacco Comment   0.25-0.5 pk/day, started new diet and work out plan, wants to try to quit   Ready to quit: Not Answered Counseling given: Not Answered Comment: 0.25-0.5 pk/day, started new  diet and work out plan, wants to try to quit   Outpatient Medications Prior to Visit  Medication Sig Dispense Refill  . Multiple Vitamins-Minerals (MULTIVITAMIN WITH MINERALS) tablet Take 1 tablet by mouth daily.    Marland Kitchen zinc gluconate 50 MG tablet Take 50 mg by mouth daily.    Marland Kitchen menthol-cetylpyridinium (CEPACOL) 3 MG lozenge Take 1 lozenge by mouth as needed. Sore throat (Patient not taking: Reported on 07/18/2020)     No facility-administered medications prior to visit.     Review of Systems:   Constitutional:   No  weight loss, night sweats,  Fevers, chills, fatigue, or  lassitude.  HEENT:   No headaches,  Difficulty swallowing,  Tooth/dental problems, or  Sore throat,                No sneezing, itching, ear ache, nasal congestion, post nasal drip,   CV:  No chest pain,  Orthopnea, PND, swelling in lower extremities, anasarca, dizziness, palpitations, syncope.   GI  No heartburn, indigestion, abdominal pain, nausea, vomiting, diarrhea, change in bowel habits, loss of appetite, bloody stools.   Resp: No shortness of breath with exertion or at rest.  No excess mucus, no productive cough,  No non-productive cough,  No coughing up of blood.  No change in color of mucus.  No wheezing.  No chest  wall deformity  Skin: no rash or lesions.  GU: no dysuria, change in color of urine, no urgency or frequency.  No flank pain, no hematuria   MS:  No joint pain or swelling.  No decreased range of motion.  No back pain.    Physical Exam  BP 120/80 (BP Location: Left Arm, Cuff Size: Normal)   Pulse 67   Temp (!) 96.1 F (35.6 C) (Temporal)   Ht 5\' 10"  (1.778 m)   Wt 254 lb 6.4 oz (115.4 kg)   SpO2 100%   BMI 36.50 kg/m   GEN: A/Ox3; pleasant , NAD, well nourished    HEENT:  [place]/AT, Class 3 MP airway  NOSE-clear, THROAT-clear, no lesions, no postnasal drip or exudate noted.   NECK:  Supple w/ fair ROM; no JVD; normal carotid impulses w/o bruits; no thyromegaly or nodules palpated; no  lymphadenopathy.    RESP  Clear  P & A; w/o, wheezes/ rales/ or rhonchi. no accessory muscle use, no dullness to percussion  CARD:  RRR, no m/r/g, no peripheral edema, pulses intact, no cyanosis or clubbing.  GI:   Soft & nt; nml bowel sounds; no organomegaly or masses detected.   Musco: Warm bil, no deformities or joint swelling noted.   Neuro: alert, no focal deficits noted.    Skin: Warm, no lesions or rashes    Lab Results:  CBC  BNP No results found for: BNP  ProBNP No results found for: PROBNP  Imaging: No results found.    No flowsheet data found.  No results found for: NITRICOXIDE      Assessment & Plan:   OSA (obstructive sleep apnea) Severe obstructive sleep apnea with significant improvement on nocturnal CPAP.  CPAP download shows excellent compliance and control.  Patient is continue with his current settings.  Plan  Patient Instructions  Keep up good work  Wear CPAP all night  Work on healthy weight  Do not drive if sleepy  Follow up with Dr. Ander Slade in 4-6 months and As needed         Moderate obesity Healthy weight loss discussed     Rexene Edison, NP 07/18/2020

## 2020-07-18 NOTE — Assessment & Plan Note (Signed)
Severe obstructive sleep apnea with significant improvement on nocturnal CPAP.  CPAP download shows excellent compliance and control.  Patient is continue with his current settings.  Plan  Patient Instructions  Keep up good work  Wear CPAP all night  Work on healthy weight  Do not drive if sleepy  Follow up with Dr. Ander Slade in 4-6 months and As needed

## 2020-08-16 DIAGNOSIS — G4733 Obstructive sleep apnea (adult) (pediatric): Secondary | ICD-10-CM | POA: Diagnosis not present

## 2021-04-23 ENCOUNTER — Emergency Department (HOSPITAL_BASED_OUTPATIENT_CLINIC_OR_DEPARTMENT_OTHER)
Admission: EM | Admit: 2021-04-23 | Discharge: 2021-04-24 | Disposition: A | Discharge status: Home / Self Care | Payer: BC Managed Care – PPO | Attending: Emergency Medicine | Admitting: Emergency Medicine

## 2021-04-23 ENCOUNTER — Encounter (HOSPITAL_BASED_OUTPATIENT_CLINIC_OR_DEPARTMENT_OTHER): Payer: Self-pay

## 2021-04-23 ENCOUNTER — Other Ambulatory Visit: Payer: Self-pay

## 2021-04-23 DIAGNOSIS — L509 Urticaria, unspecified: Secondary | ICD-10-CM | POA: Diagnosis not present

## 2021-04-23 DIAGNOSIS — T7840XA Allergy, unspecified, initial encounter: Secondary | ICD-10-CM | POA: Diagnosis not present

## 2021-04-23 DIAGNOSIS — Z5321 Procedure and treatment not carried out due to patient leaving prior to being seen by health care provider: Secondary | ICD-10-CM | POA: Diagnosis not present

## 2021-04-23 NOTE — ED Triage Notes (Signed)
Pt c/o  hive following  a stung by insect about an hr ago - denies SOB   Took bendryl 30 - 45 mins ago   No signs of distress at this time

## 2021-04-24 NOTE — ED Notes (Signed)
Registration Staff made this RN aware that the patient is no longer in the Waiting Area. Patient will be discharged appropriately.

## 2021-06-21 IMAGING — CR ABDOMEN - 1 VIEW
2 series · 2 of 2 positions shown · non-contrast
Comparison: CT 05/26/2009

CLINICAL DATA: Left flank/groin pain. History of kidney stone.

EXAM:
ABDOMEN - 1 VIEW

[t abdomen supine (1 of 2)]
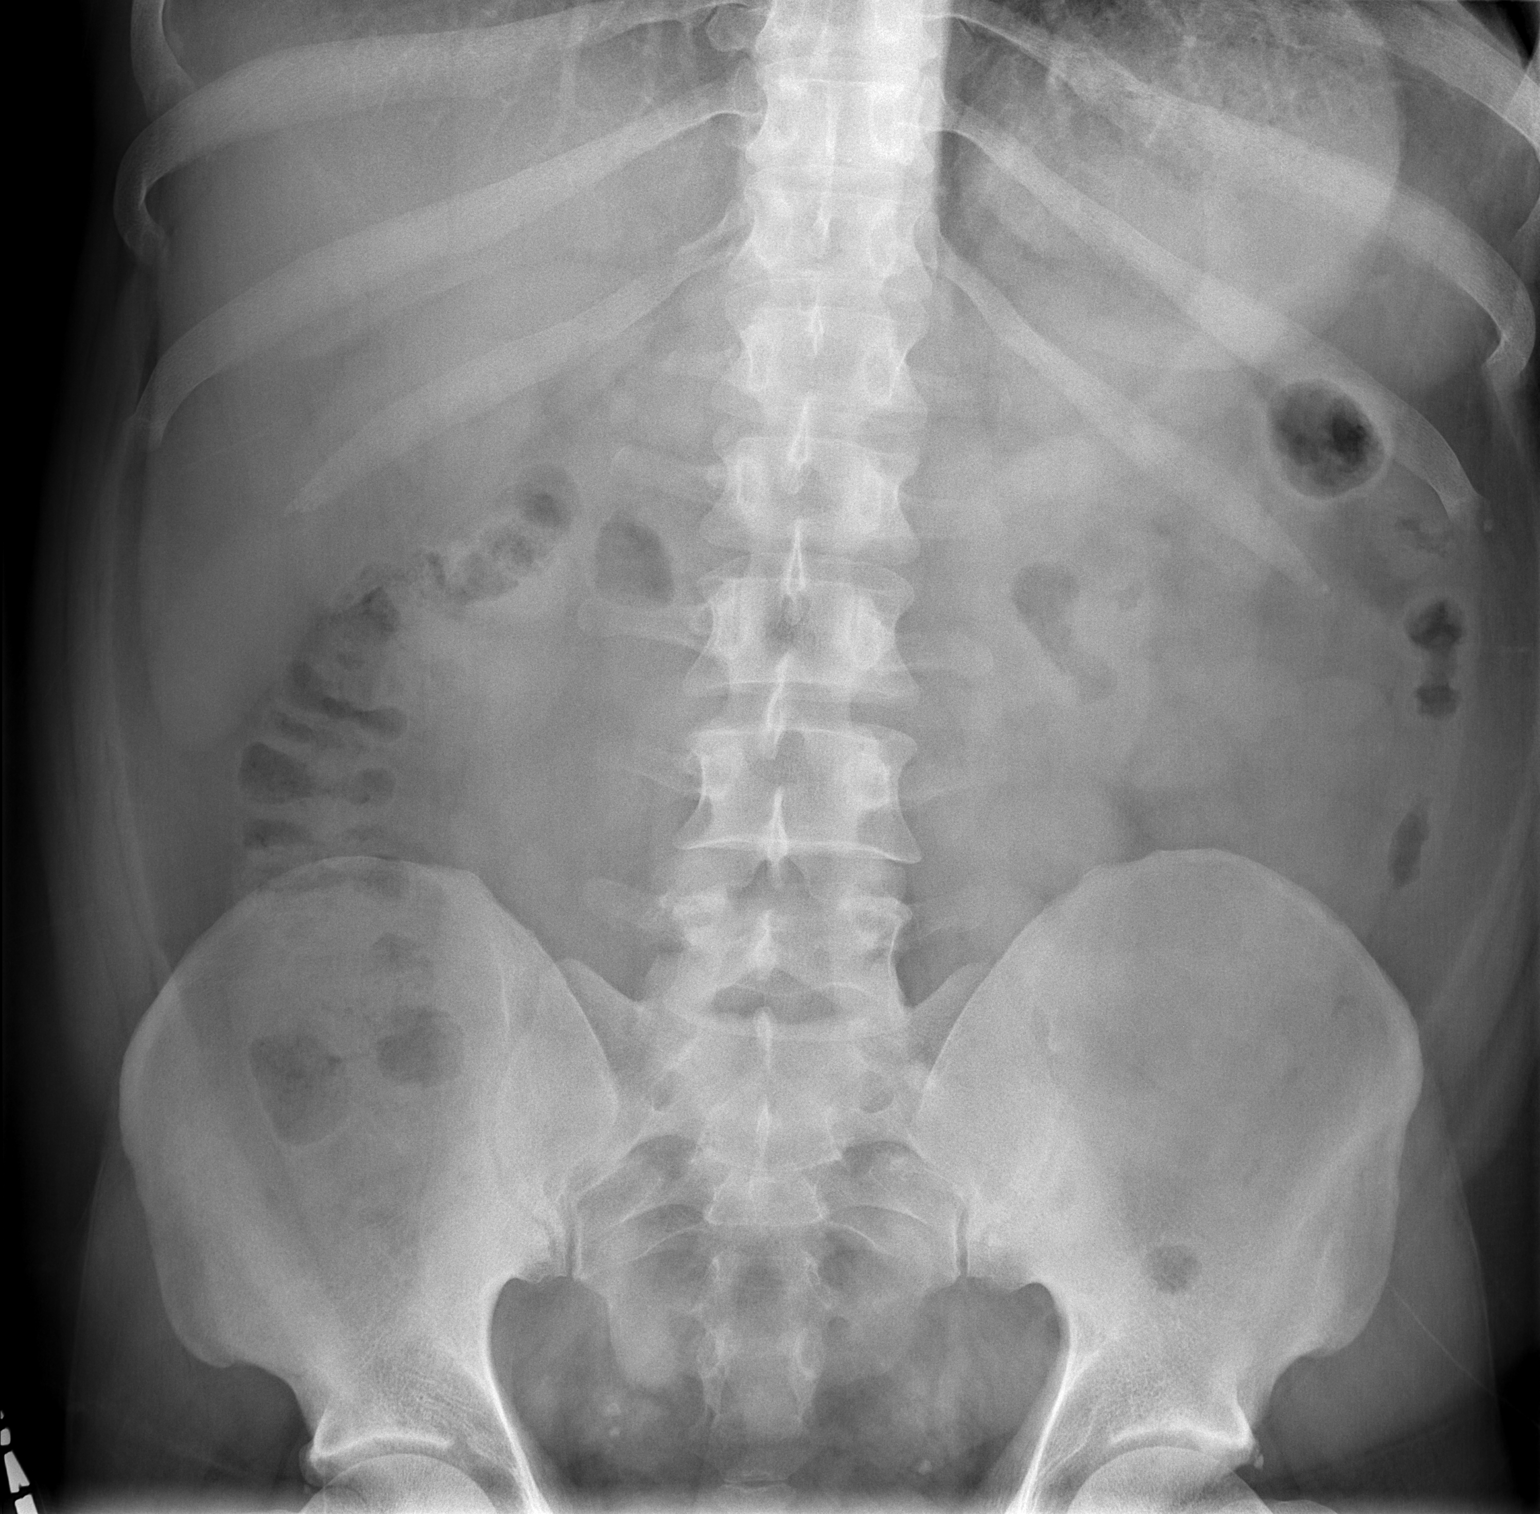

[t abdomen supine (2 of 2)]
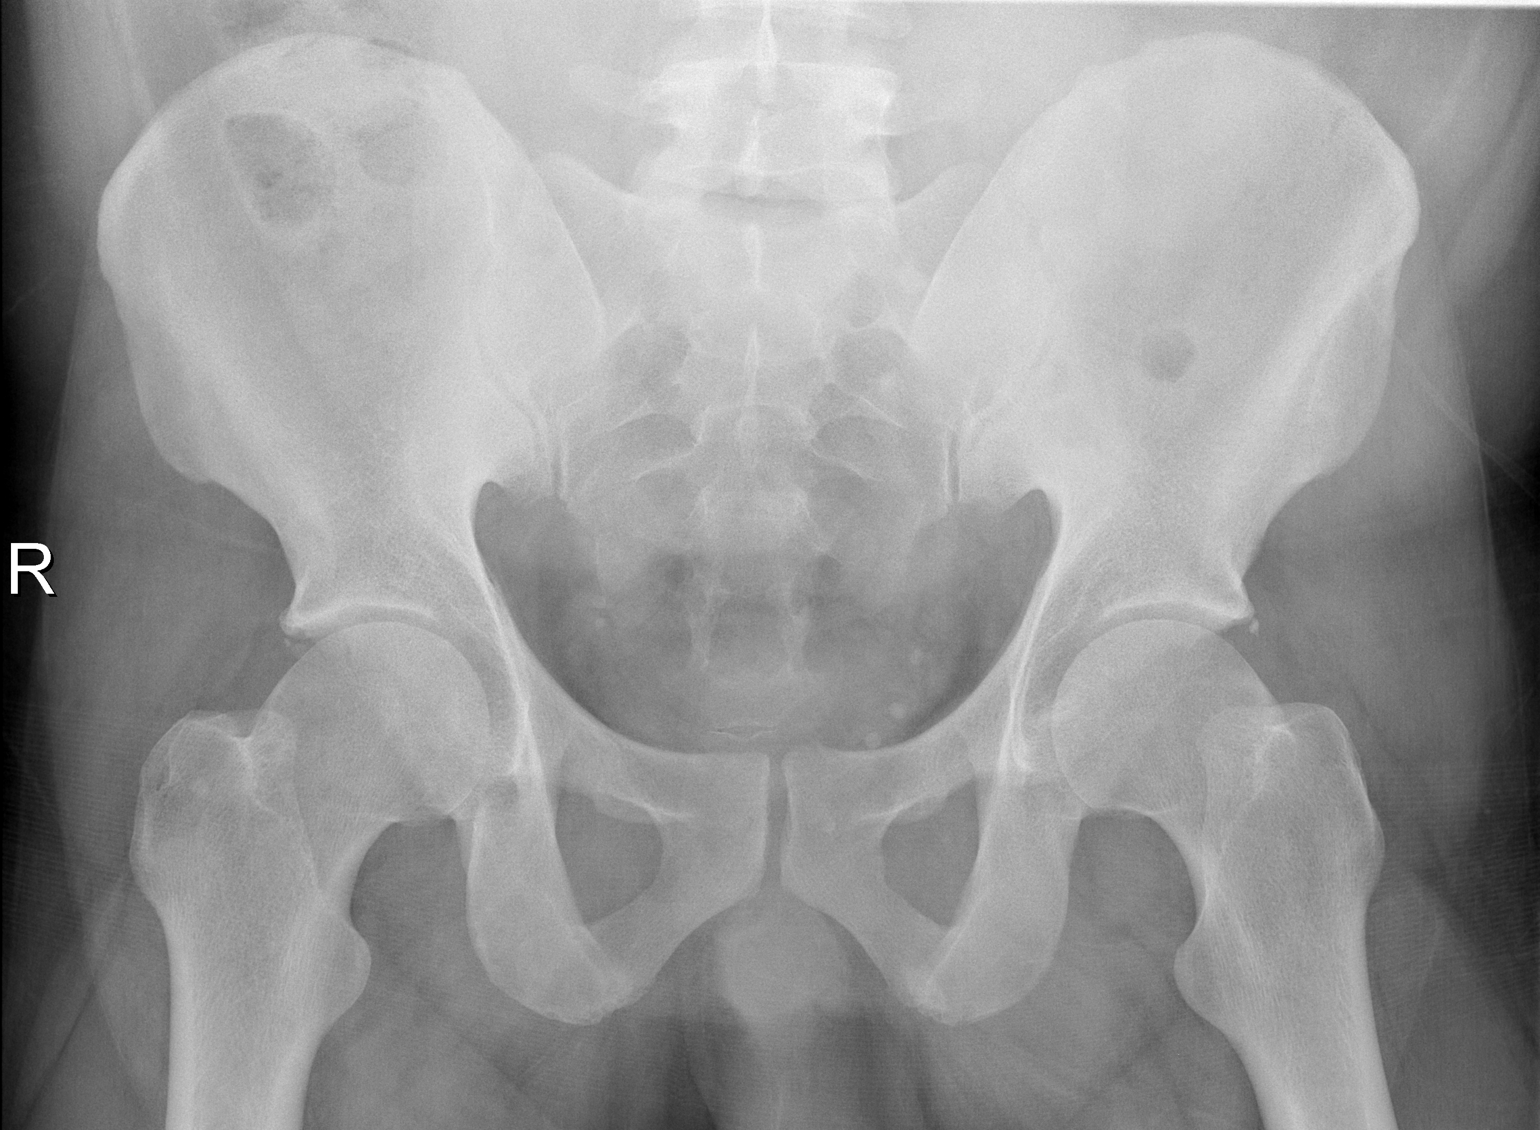

[2 of 2 positions shown; findings below may reference images not displayed]

FINDINGS: Three rounded calcifications in the left pelvis, at least 2 of which
were present on prior exam and represent phleboliths. In all measure
approximately 4 mm. 2 calcifications in the right pelvis which were
not seen on prior CT. No other calcifications project over the renal
beds or course of the ureters. Two bone islands in the left sacrum.
Normal bowel gas pattern. No osseous abnormalities.
IMPRESSION: Three rounded calcifications in the left pelvis, at least 2 of which
were present on 0787 CT and represent phleboliths. The additional
calcification may represent a phlebolith or distal ureteric stone
given left-sided pain.

## 2022-01-25 DIAGNOSIS — J020 Streptococcal pharyngitis: Secondary | ICD-10-CM | POA: Diagnosis not present

## 2022-02-01 ENCOUNTER — Encounter: Payer: Self-pay | Admitting: Medical

## 2022-02-01 ENCOUNTER — Ambulatory Visit (INDEPENDENT_AMBULATORY_CARE_PROVIDER_SITE_OTHER): Payer: BC Managed Care – PPO | Admitting: Medical

## 2022-02-01 VITALS — BP 114/70 | HR 58 | Temp 98.2°F | Resp 18 | Ht 70.0 in | Wt 216.0 lb

## 2022-02-01 DIAGNOSIS — Z1211 Encounter for screening for malignant neoplasm of colon: Secondary | ICD-10-CM | POA: Diagnosis not present

## 2022-02-01 DIAGNOSIS — Z0000 Encounter for general adult medical examination without abnormal findings: Secondary | ICD-10-CM

## 2022-02-01 LAB — COMPREHENSIVE METABOLIC PANEL
ALT: 36 U/L (ref 0–53)
AST: 20 U/L (ref 0–37)
Albumin: 4.6 g/dL (ref 3.5–5.2)
Alkaline Phosphatase: 66 U/L (ref 39–117)
BUN: 11 mg/dL (ref 6–23)
CO2: 29 mEq/L (ref 19–32)
Calcium: 9.7 mg/dL (ref 8.4–10.5)
Chloride: 105 mEq/L (ref 96–112)
Creatinine, Ser: 0.82 mg/dL (ref 0.40–1.50)
GFR: 112.13 mL/min (ref >60.00)
Glucose, Bld: 98 mg/dL (ref 70–99)
Potassium: 4.8 mEq/L (ref 3.5–5.1)
Sodium: 140 mEq/L (ref 135–145)
Total Bilirubin: 1.3 mg/dL — ABNORMAL HIGH (ref 0.2–1.2)
Total Protein: 7.4 g/dL (ref 6.0–8.3)

## 2022-02-01 LAB — CBC WITH DIFFERENTIAL/PLATELET
Basophils Absolute: 0.1 10*3/uL (ref 0.0–0.1)
Basophils Relative: 0.8 % (ref 0.0–3.0)
Eosinophils Absolute: 0.2 10*3/uL (ref 0.0–0.7)
Eosinophils Relative: 2.4 % (ref 0.0–5.0)
HCT: 41.3 % (ref 39.0–52.0)
Hemoglobin: 13.9 g/dL (ref 13.0–17.0)
Lymphocytes Relative: 35.6 % (ref 12.0–46.0)
Lymphs Abs: 3.1 10*3/uL (ref 0.7–4.0)
MCHC: 33.5 g/dL (ref 30.0–36.0)
MCV: 91.6 fl (ref 78.0–100.0)
Monocytes Absolute: 0.5 10*3/uL (ref 0.1–1.0)
Monocytes Relative: 6 % (ref 3.0–12.0)
Neutro Abs: 4.8 10*3/uL (ref 1.4–7.7)
Neutrophils Relative %: 55.2 % (ref 43.0–77.0)
Platelets: 263 10*3/uL (ref 150.0–400.0)
RBC: 4.51 Mil/uL (ref 4.22–5.81)
RDW: 13.2 % (ref 11.5–15.5)
WBC: 8.6 10*3/uL (ref 4.0–10.5)

## 2022-02-01 LAB — LIPID PANEL
Cholesterol: 169 mg/dL (ref 0–200)
HDL: 34.3 mg/dL — ABNORMAL LOW (ref >39.00)
LDL Cholesterol: 119 mg/dL — ABNORMAL HIGH (ref 0–99)
NonHDL: 134.89
Total CHOL/HDL Ratio: 5
Triglycerides: 77 mg/dL (ref 0.0–149.0)
VLDL: 15.4 mg/dL (ref 0.0–40.0)

## 2022-02-01 NOTE — Patient Instructions (Addendum)
For you wellness exam today I have ordered cbc, cmp and  lipid panel. ? ?Vaccines- declined covid vaccines. ? ?Recommend exercise and healthy diet. ? ?We will let you know lab results as they come in. ? ?Follow up date appointment will be determined after lab review.   ? ?Placed referral to GI MD office to evaluate for screening colon cancer based on family history. ? ? ?Preventive Care 84-38 Years Old, Male ?Preventive care refers to lifestyle choices and visits with your health care provider that can promote health and wellness. Preventive care visits are also called wellness exams. ?What can I expect for my preventive care visit? ?Counseling ?During your preventive care visit, your health care provider may ask about your: ?Medical history, including: ?Past medical problems. ?Family medical history. ?Current health, including: ?Emotional well-being. ?Home life and relationship well-being. ?Sexual activity. ?Lifestyle, including: ?Alcohol, nicotine or tobacco, and drug use. ?Access to firearms. ?Diet, exercise, and sleep habits. ?Safety issues such as seatbelt and bike helmet use. ?Sunscreen use. ?Work and work Statistician. ?Physical exam ?Your health care provider may check your: ?Height and weight. These may be used to calculate your BMI (body mass index). BMI is a measurement that tells if you are at a healthy weight. ?Waist circumference. This measures the distance around your waistline. This measurement also tells if you are at a healthy weight and may help predict your risk of certain diseases, such as type 2 diabetes and high blood pressure. ?Heart rate and blood pressure. ?Body temperature. ? ?Vaccines are usually given at various ages, according to a schedule. Your health care provider will recommend vaccines for you based on your age, medical history, and lifestyle or other factors, such as travel or where you work. ?What tests do I need? ?Screening ?Your health care provider may recommend screening tests  for certain conditions. This may include: ?Lipid and cholesterol levels. ?Diabetes screening. This is done by checking your blood sugar (glucose) after you have not eaten for a while (fasting). ?Hepatitis B test. ?Hepatitis C test. ?HIV (human immunodeficiency virus) test. ?STI (sexually transmitted infection) testing, if you are at risk. ?Talk with your health care provider about your test results, treatment options, and if necessary, the need for more tests. ?Follow these instructions at home: ?Eating and drinking ? ?Eat a healthy diet that includes fresh fruits and vegetables, whole grains, lean protein, and low-fat dairy products. ?Drink enough fluid to keep your urine pale yellow. ?Take vitamin and mineral supplements as recommended by your health care provider. ?Do not drink alcohol if your health care provider tells you not to drink. ?If you drink alcohol: ?Limit how much you have to 0-2 drinks a day. ?Know how much alcohol is in your drink. In the [place] one drink equals one 12 oz bottle of beer (355 mL), one 5 oz glass of wine (148 mL), or one 1? oz glass of hard liquor (44 mL). ?Lifestyle ?Brush your teeth every morning and night with fluoride toothpaste. Floss one time each day. ?Exercise for at least 30 minutes 5 or more days each week. ?Do not use any products that contain nicotine or tobacco. These products include cigarettes, chewing tobacco, and vaping devices, such as e-cigarettes. If you need help quitting, ask your health care provider. ?Do not use drugs. ?If you are sexually active, practice safe sex. Use a condom or other form of protection to prevent STIs. ?Find healthy ways to manage stress, such as: ?Meditation, yoga, or listening to music. ?Journaling. ?Talking to  a trusted person. ?Spending time with friends and family. ?Minimize exposure to UV radiation to reduce your risk of skin cancer. ?Safety ?Always wear your seat belt while driving or riding in a vehicle. ?Do not drive: ?If you have  been drinking alcohol. Do not ride with someone who has been drinking. ?If you have been using any mind-altering substances or drugs. ?While texting. ?When you are tired or distracted. ?Wear a helmet and other protective equipment during sports activities. ?If you have firearms in your house, make sure you follow all gun safety procedures. ?Seek help if you have been physically or sexually abused. ?What's next? ?Go to your health care provider once a year for an annual wellness visit. ?Ask your health care provider how often you should have your eyes and teeth checked. ?Stay up to date on all vaccines. ?This information is not intended to replace advice given to you by your health care provider. Make sure you discuss any questions you have with your health care provider. ?Document Revised: 04/01/2021 Document Reviewed: 04/01/2021 ?Elsevier Patient Education ? [place]. ? ? ? ?

## 2022-02-01 NOTE — Progress Notes (Signed)
? ?Subjective:  ? ? Patient ID: Stephen Carson, male    DOB: 12-08-1985, 36 y.o.   MRN: [id number] ? ?HPI ?Pt in for wellness exam.  ? ?Pt is fasting. Pt states has been working on loosing weight. He has lost 270 lb to 216 lb. He states he has limited his diet to 1500 calories a day. He also quit smoking. He is not drinking alcohol. Pt states some exercise 3 times a week.  ? ?Pt states his grandad passed away from colon cancer. His dad got his first screening at 25 years of age. So pt wants to get screening. No current gi signs symptoms. No black or bloody stools.  ? ? ?Pt declines covid vaccines. ? ?Review of Systems  ?Constitutional:  Negative for chills, fatigue and fever.  ?HENT:  Negative for congestion.   ?Respiratory:  Negative for cough, shortness of breath and wheezing.   ?Cardiovascular:  Negative for chest pain and palpitations.  ?Gastrointestinal:  Negative for abdominal pain.  ?Musculoskeletal:  Negative for back pain.  ?Neurological:  Negative for dizziness, syncope, weakness, numbness and headaches.  ?Hematological:  Negative for adenopathy. Does not bruise/bleed easily.  ?Psychiatric/Behavioral:  Negative for behavioral problems.   ? ? ?Past Medical History:  ?Diagnosis Date  ? Peritonsillar abscess   ? ?  ?Social History  ? ?Socioeconomic History  ? Marital status: Married  ?  Spouse name: Not on file  ? Number of children: Not on file  ? Years of education: Not on file  ? Highest education level: Not on file  ?Occupational History  ? Not on file  ?Tobacco Use  ? Smoking status: Former  ?  Packs/day: 1.00  ?  Types: Cigarettes  ?  Quit date: 08/17/2021  ?  Years since quitting: 0.4  ? Smokeless tobacco: Never  ? Tobacco comments:  ?  0.25-0.5 pk/day, started new diet and work out plan, wants to try to quit  ?Vaping Use  ? Vaping Use: Never used  ?Substance and Sexual Activity  ? Alcohol use: No  ? Drug use: No  ? Sexual activity: Never  ?Other Topics Concern  ? Not on file  ?Social History Narrative  ?  Not on file  ? ?Social Determinants of Health  ? ?Financial Resource Strain: Not on file  ?Food Insecurity: Not on file  ?Transportation Needs: Not on file  ?Physical Activity: Not on file  ?Stress: Not on file  ?Social Connections: Not on file  ?Intimate Partner Violence: Not on file  ? ? ?History reviewed. No pertinent surgical history. ? ?History reviewed. No pertinent family history. ? ?No Known Allergies ? ?No current outpatient medications on file prior to visit.  ? ?No current facility-administered medications on file prior to visit.  ? ? ?BP 114/70   Pulse (!) 58   Temp 98.2 ?F (36.8 ?C)   Resp 18   Ht '5\' 10"'$  (1.778 m)   Wt 216 lb (98 kg)   SpO2 100%   BMI 30.99 kg/m?  ?  ?   ?Objective:  ? Physical Exam ? ?General ?Mental Status- Alert. General Appearance- Not in acute distress.  ? ?Skin ?General: Color- Normal Color. Moisture- Normal Moisture. ? ?Neck ?Carotid Arteries- Normal color. Moisture- Normal Moisture. No carotid bruits. No JVD. ? ?Chest and Lung Exam ?Auscultation: ?Breath Sounds:-Normal. ? ?Cardiovascular ?Auscultation:Rythm- Regular. ?Murmurs & Other Heart Sounds:Auscultation of the heart reveals- No Murmurs. ? ?Abdomen ?Inspection:-Inspeection Normal. ?Palpation/Percussion:Note:No mass. Palpation and Percussion of the abdomen reveal- Non Tender,  Non Distended + BS, no rebound or guarding. ? ? ?Neurologic ?Cranial Nerve exam:- CN III-XII intact(No nystagmus), symmetric smile. ?Strength:- 5/5 equal and symmetric strength both upper and lower extremities.  ? ? ?   ?Assessment & Plan:  ? ?Patient Instructions  ?For you wellness exam today I have ordered cbc, cmp and  lipid panel. ? ?Vaccines- declined covid vaccines. ? ?Recommend exercise and healthy diet. ? ?We will let you know lab results as they come in. ? ?Follow up date appointment will be determined after lab review.   ? ?Placed referral to GI MD office to evaluate for screening colon cancer based on family history. ? ?  ?Mackie Pai, PA-C  ?

## 2022-02-24 ENCOUNTER — Encounter: Payer: Self-pay | Admitting: Gastroenterology

## 2022-02-24 ENCOUNTER — Ambulatory Visit: Payer: BC Managed Care – PPO | Admitting: Gastroenterology

## 2022-02-24 VITALS — BP 120/80 | HR 64 | Ht 70.0 in | Wt 212.0 lb

## 2022-02-24 DIAGNOSIS — Z8371 Family history of colonic polyps: Secondary | ICD-10-CM | POA: Diagnosis not present

## 2022-02-24 DIAGNOSIS — Z1211 Encounter for screening for malignant neoplasm of colon: Secondary | ICD-10-CM

## 2022-02-24 DIAGNOSIS — Z800 Family history of malignant neoplasm of digestive organs: Secondary | ICD-10-CM | POA: Insufficient documentation

## 2022-02-24 MED ORDER — NA SULFATE-K SULFATE-MG SULF 17.5-3.13-1.6 GM/177ML PO SOLN: 1.0000 | Freq: Once | ORAL | 0 refills | Status: AC

## 2022-02-24 NOTE — Patient Instructions (Signed)
You have been scheduled for a colonoscopy. Please follow written instructions given to you at your visit today.  ?Please pick up your prep supplies at the pharmacy within the next 1-3 days. ?If you use inhalers (even only as needed), please bring them with you on the day of your procedure. ? ?We have sent the following medications to your pharmacy for you to pick up at your convenience: ?[place]  ? ?If you are age 38 or older, your body mass index should be between 23-30. Your Body mass index is 30.42 kg/m?Marland Kitchen If this is out of the aforementioned range listed, please consider follow up with your Primary Care Provider. ? ?If you are age 61 or younger, your body mass index should be between 19-25. Your Body mass index is 30.42 kg/m?Marland Kitchen If this is out of the aformentioned range listed, please consider follow up with your Primary Care Provider.  ? ?________________________________________________________ ? ?The [facility] providers would like to encourage you to use [facility] to communicate with providers for non-urgent requests or questions.  Due to long hold times on the telephone, sending your provider a message by [facility] may be a faster and more efficient way to get a response.  Please allow 48 business hours for a response.  Please remember that this is for non-urgent requests.  ?_______________________________________________________ ? ?Thank you for choosing me and [facility]. ? ? ? ?

## 2022-02-24 NOTE — Progress Notes (Signed)
I agree with the above note, plan 

## 2022-02-24 NOTE — Progress Notes (Signed)
? ? ? ?  02/24/2022 ?Stephen Carson ?[id number] ?1986/08/20 ? ? ?HISTORY OF PRESENT ILLNESS: This is a 36 year old male who is new to our office.  He has been referred here by Mackie Pai, PA-C, in order to talk about discuss colonoscopy.  He says that he has a family history of colon cancer in his paternal grandfather who passed away from this at age 65.  Family history of colon polyps in his father who has had multiple polyps removed, receives colonoscopy every 3 years through the [place]. He would like to have a colonoscopy for these reasons.  He tells me that he does not have any GI complaints currently.  He says that he used to have some issues with hemorrhoids and some other gut/intestinal issues, but he recently lost 70 pounds and since changing his diet and losing weight all of those have resolved. ? ? ?Past Medical History:  ?Diagnosis Date  ? Hemorrhoids   ? Peritonsillar abscess   ? ?History reviewed. No pertinent surgical history. ? reports that he quit smoking about 6 months ago. His smoking use included cigarettes. He smoked an average of 1 pack per day. He has never used smokeless tobacco. He reports that he does not drink alcohol and does not use drugs. ?family history includes Colon cancer in his maternal grandfather; Colon polyps in his father; Crohn's disease in his father; Diabetes in his father. ?No Known Allergies ? ?  ?No outpatient encounter medications on file as of 02/24/2022.  ? ?No facility-administered encounter medications on file as of 02/24/2022.  ? ? ? ?REVIEW OF SYSTEMS  : All other systems reviewed and negative except where noted in the History of Present Illness. ? ? ?PHYSICAL EXAM: ?BP 120/80   Pulse 64   Ht '5\' 10"'$  (1.778 m)   Wt 212 lb (96.2 kg)   SpO2 98%   BMI 30.42 kg/m?  ?General: Well developed male in no acute distress ?Head: Normocephalic and atraumatic ?Eyes:  Sclerae anicteric, conjunctiva pink. ?Ears: Normal auditory acuity ?Lungs: Clear throughout to auscultation; no  W/R/R. ?Heart: Regular rate and rhythm; no M/R/G. ?Abdomen: Soft, non-distended.  BS present.  Non-tender. ?Rectal:  Will be done at the time of colonoscopy. ?Musculoskeletal: Symmetrical with no gross deformities  ?Skin: No lesions on visible extremities ?Extremities: No edema  ?Neurological: Alert oriented x 4, grossly non-focal ?Psychological:  Alert and cooperative. Normal mood and affect ? ?ASSESSMENT AND PLAN: ?*Family history of colon cancer in his paternal grandfather who passed away from this at age 20.  Family history of colon polyps in his father who has had multiple polyps removed, receives colonoscopy every 3 years through the [place].  We will plan for colonoscopy with Dr. Ardis Hughs.  The risks, benefits, and alternatives to colonoscopy were discussed with the patient and he consents to proceed.  ? ?CC:  Saguier, Percell Miller, PA-C ? ?  ?

## 2022-03-03 ENCOUNTER — Encounter: Payer: Self-pay | Admitting: Gastroenterology

## 2022-03-10 ENCOUNTER — Encounter: Payer: Self-pay | Admitting: Gastroenterology

## 2022-03-10 ENCOUNTER — Ambulatory Visit (AMBULATORY_SURGERY_CENTER): Payer: BC Managed Care – PPO | Admitting: Gastroenterology

## 2022-03-10 VITALS — BP 116/78 | HR 64 | Temp 98.2°F | Resp 11 | Ht 70.0 in | Wt 212.0 lb

## 2022-03-10 DIAGNOSIS — D124 Benign neoplasm of descending colon: Secondary | ICD-10-CM | POA: Diagnosis not present

## 2022-03-10 DIAGNOSIS — Z800 Family history of malignant neoplasm of digestive organs: Secondary | ICD-10-CM | POA: Diagnosis not present

## 2022-03-10 DIAGNOSIS — D122 Benign neoplasm of ascending colon: Secondary | ICD-10-CM | POA: Diagnosis not present

## 2022-03-10 DIAGNOSIS — Z1211 Encounter for screening for malignant neoplasm of colon: Secondary | ICD-10-CM

## 2022-03-10 DIAGNOSIS — Z8371 Family history of colonic polyps: Secondary | ICD-10-CM | POA: Diagnosis not present

## 2022-03-10 MED ORDER — SODIUM CHLORIDE 0.9 % IV SOLN: 500.0000 mL | Freq: Once | INTRAVENOUS | Status: DC

## 2022-03-10 NOTE — Op Note (Signed)
[facility] Patient Name: Stephen Carson Procedure Date: 03/10/2022 1:28 PM MRN: [id number] Endoscopist: Milus Banister , MD Age: 36 Referring MD:  Date of Birth: 1986-09-23 Gender: Male Account #: [id number] Procedure:                Colonoscopy Indications:              Colon cancer screening in patient at increased                            risk: PGF had colon cancer in his 1s. Father with                            colon polyps Medicines:                Monitored Anesthesia Care Procedure:                Pre-Anesthesia Assessment:                           - Prior to the procedure, a History and Physical                            was performed, and patient medications and                            allergies were reviewed. The patient's tolerance of                            previous anesthesia was also reviewed. The risks                            and benefits of the procedure and the sedation                            options and risks were discussed with the patient.                            All questions were answered, and informed consent                            was obtained. Prior Anticoagulants: The patient has                            taken no previous anticoagulant or antiplatelet                            agents. ASA Grade Assessment: II - A patient with                            mild systemic disease. After reviewing the risks                            and benefits, the patient was deemed in  satisfactory condition to undergo the procedure.                           After obtaining informed consent, the colonoscope                            was passed under direct vision. Throughout the                            procedure, the patient's blood pressure, pulse, and                            oxygen saturations were monitored continuously. The                            CF [id number] #[id number] was introduced through the anus                             and advanced to the the cecum, identified by                            appendiceal orifice and ileocecal valve. The                            colonoscopy was performed without difficulty. The                            patient tolerated the procedure well. The quality                            of the bowel preparation was good. The ileocecal                            valve, appendiceal orifice, and rectum were                            photographed. Scope In: 1:32:00 PM Scope Out: 1:44:32 PM Scope Withdrawal Time: 0 hours 9 minutes 44 seconds  Total Procedure Duration: 0 hours 12 minutes 32 seconds  Findings:                 Three sessile polyps were found in the descending                            colon and ascending colon. The polyps were 2 to 4                            mm in size. These polyps were removed with a cold                            snare. Resection and retrieval were complete.                           The exam was otherwise without abnormality on  direct and retroflexion views. Complications:            No immediate complications. Estimated blood loss:                            None. Estimated Blood Loss:     Estimated blood loss: none. Impression:               - Three 2 to 4 mm polyps in the descending colon                            and in the ascending colon, removed with a cold                            snare. Resected and retrieved.                           - The examination was otherwise normal on direct                            and retroflexion views. Recommendation:           - Patient has a contact number available for                            emergencies. The signs and symptoms of potential                            delayed complications were discussed with the                            patient. Return to normal activities tomorrow.                            Written discharge instructions  were provided to the                            patient.                           - Resume previous diet.                           - Continue present medications.                           - Await pathology results. Milus Banister, MD 03/10/2022 1:48:09 PM This report has been signed electronically.

## 2022-03-10 NOTE — Progress Notes (Signed)
Called to room to assist during endoscopic procedure.  Patient ID and intended procedure confirmed with present staff. Received instructions for my participation in the procedure from the performing physician.  

## 2022-03-10 NOTE — Progress Notes (Signed)
Pt's states no medical or surgical changes since previsit or office visit. 

## 2022-03-10 NOTE — Patient Instructions (Signed)
Handout provided on polyps.   YOU HAD AN ENDOSCOPIC PROCEDURE TODAY AT THE [facility]:   Refer to the procedure report that was given to you for any specific questions about what was found during the examination.  If the procedure report does not answer your questions, please call your gastroenterologist to clarify.  If you requested that your care partner not be given the details of your procedure findings, then the procedure report has been included in a sealed envelope for you to review at your convenience later.  YOU SHOULD EXPECT: Some feelings of bloating in the abdomen. Passage of more gas than usual.  Walking can help get rid of the air that was put into your GI tract during the procedure and reduce the bloating. If you had a lower endoscopy (such as a colonoscopy or flexible sigmoidoscopy) you may notice spotting of blood in your stool or on the toilet paper. If you underwent a bowel prep for your procedure, you may not have a normal bowel movement for a few days.  Please Note:  You might notice some irritation and congestion in your nose or some drainage.  This is from the oxygen used during your procedure.  There is no need for concern and it should clear up in a day or so.  SYMPTOMS TO REPORT IMMEDIATELY:  Following lower endoscopy (colonoscopy or flexible sigmoidoscopy):  Excessive amounts of blood in the stool  Significant tenderness or worsening of abdominal pains  Swelling of the abdomen that is new, acute  Fever of 100F or higher  For urgent or emergent issues, a gastroenterologist can be reached at any hour by calling [phone]. Do not use MyChart messaging for urgent concerns.    DIET:  We do recommend a small meal at first, but then you may proceed to your regular diet.  Drink plenty of fluids but you should avoid alcoholic beverages for 24 hours.  ACTIVITY:  You should plan to take it easy for the rest of today and you should NOT DRIVE or use heavy  machinery until tomorrow (because of the sedation medicines used during the test).    FOLLOW UP: Our staff will call the number listed on your records 48-72 hours following your procedure to check on you and address any questions or concerns that you may have regarding the information given to you following your procedure. If we do not reach you, we will leave a message.  We will attempt to reach you two times.  During this call, we will ask if you have developed any symptoms of COVID 19. If you develop any symptoms (ie: fever, flu-like symptoms, shortness of breath, cough etc.) before then, please call [phone].  If you test positive for Covid 19 in the 2 weeks post procedure, please call and report this information to us.    If any biopsies were taken you will be contacted by phone or by letter within the next 1-3 weeks.  Please call us at [phone] if you have not heard about the biopsies in 3 weeks.    SIGNATURES/CONFIDENTIALITY: You and/or your care partner have signed paperwork which will be entered into your electronic medical record.  These signatures attest to the fact that that the information above on your After Visit Summary has been reviewed and is understood.  Full responsibility of the confidentiality of this discharge information lies with you and/or your care-partner.  

## 2022-03-10 NOTE — Progress Notes (Signed)
  The recent H&P (dated this [facility]) was reviewed, the patient was examined and there is no change in the patients condition since that H&P was completed.   Stephen Carson  03/10/2022, 1:06 PM

## 2022-03-11 ENCOUNTER — Telehealth: Payer: Self-pay

## 2022-03-11 NOTE — Telephone Encounter (Signed)
  Follow up Call-     03/10/2022    1:07 PM  Call back number  Post procedure Call Back phone  # [phone]  Permission to leave phone message Yes     Patient questions:  Do you have a fever, pain , or abdominal swelling? No. Pain Score  0 *  Have you tolerated food without any problems? Yes.    Have you been able to return to your normal activities? Yes.    Do you have any questions about your discharge instructions: Diet   No. Medications  No. Follow up visit  No.  Do you have questions or concerns about your Care? No.  Actions: * If pain score is 4 or above: No action needed, pain <4.

## 2022-03-17 ENCOUNTER — Encounter: Payer: Self-pay | Admitting: Gastroenterology

## 2024-06-29 DIAGNOSIS — U071 COVID-19: Secondary | ICD-10-CM | POA: Diagnosis not present

## 2024-06-29 DIAGNOSIS — J029 Acute pharyngitis, unspecified: Secondary | ICD-10-CM | POA: Diagnosis not present

## 2024-06-29 DIAGNOSIS — H9209 Otalgia, unspecified ear: Secondary | ICD-10-CM | POA: Diagnosis not present

## 2024-06-29 DIAGNOSIS — R519 Headache, unspecified: Secondary | ICD-10-CM | POA: Diagnosis not present
